# Patient Record
Sex: Female | Born: 2000 | Race: Black or African American | Hispanic: No | Marital: Single | Smoking: Never smoker
Health system: Southern US, Community
[De-identification: ages and names within clinical notes are randomized; demographics above are authoritative.]

## PROBLEM LIST (undated history)

## (undated) DIAGNOSIS — Z975 Presence of (intrauterine) contraceptive device: Secondary | ICD-10-CM

## (undated) DIAGNOSIS — R1031 Right lower quadrant pain: Secondary | ICD-10-CM

## (undated) DIAGNOSIS — N83202 Unspecified ovarian cyst, left side: Secondary | ICD-10-CM

## (undated) DIAGNOSIS — R1030 Lower abdominal pain, unspecified: Principal | ICD-10-CM

## (undated) DIAGNOSIS — E669 Obesity, unspecified: Secondary | ICD-10-CM

## (undated) HISTORY — DX: Obesity, unspecified: E66.9

## (undated) HISTORY — DX: Right lower quadrant pain: R10.31

## (undated) HISTORY — DX: Lower abdominal pain, unspecified: R10.30

## (undated) HISTORY — PX: TONSILLECTOMY: SUR1361

## (undated) HISTORY — DX: Presence of (intrauterine) contraceptive device: Z97.5

## (undated) HISTORY — DX: Unspecified ovarian cyst, left side: N83.202

---
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
## 2018-05-28 DIAGNOSIS — Z111 Encounter for screening for respiratory tuberculosis: Secondary | ICD-10-CM | POA: Diagnosis not present

## 2018-06-28 DIAGNOSIS — H1089 Other conjunctivitis: Secondary | ICD-10-CM | POA: Diagnosis not present

## 2018-06-28 DIAGNOSIS — J069 Acute upper respiratory infection, unspecified: Secondary | ICD-10-CM | POA: Diagnosis not present

## 2018-06-28 DIAGNOSIS — H9201 Otalgia, right ear: Secondary | ICD-10-CM | POA: Diagnosis not present

## 2018-09-25 ENCOUNTER — Ambulatory Visit (INDEPENDENT_AMBULATORY_CARE_PROVIDER_SITE_OTHER): Payer: Medicaid Other | Admitting: Adult Health

## 2018-09-25 ENCOUNTER — Encounter: Payer: Self-pay | Admitting: Adult Health

## 2018-09-25 VITALS — BP 119/77 | HR 69 | Ht 62.0 in | Wt 212.0 lb

## 2018-09-25 DIAGNOSIS — Z30011 Encounter for initial prescription of contraceptive pills: Secondary | ICD-10-CM

## 2018-09-25 DIAGNOSIS — Z3202 Encounter for pregnancy test, result negative: Secondary | ICD-10-CM | POA: Diagnosis not present

## 2018-09-25 DIAGNOSIS — N946 Dysmenorrhea, unspecified: Secondary | ICD-10-CM | POA: Insufficient documentation

## 2018-09-25 LAB — POCT URINE PREGNANCY: Preg Test, Ur: NEGATIVE

## 2018-09-25 MED ORDER — NORETHIN-ETH ESTRAD-FE BIPHAS 1 MG-10 MCG / 10 MCG PO TABS: 1.0000 | ORAL_TABLET | Freq: Every day | ORAL | 11 refills | Status: DC

## 2018-09-25 NOTE — Patient Instructions (Signed)
Oral Contraception Use Oral contraceptive pills (OCPs) are medicines taken to prevent pregnancy. OCPs work by preventing the ovaries from releasing eggs. The hormones in OCPs also cause the cervical mucus to thicken, preventing the sperm from entering the uterus. The hormones also cause the uterine lining to become thin, not allowing a fertilized egg to attach to the inside of the uterus. OCPs are highly effective when taken exactly as prescribed. However, OCPs do not prevent sexually transmitted diseases (STDs). Safe sex practices, such as using condoms along with an OCP, can help prevent STDs. Before taking OCPs, you may have a physical exam and Pap test. Your health care provider may also order blood tests if necessary. Your health care provider will make sure you are a good candidate for oral contraception. Discuss with your health care provider the possible side effects of the OCP you may be prescribed. When starting an OCP, it can take 2 to 3 months for the body to adjust to the changes in hormone levels in your body. How to take oral contraceptive pills Your health care provider may advise you on how to start taking the first cycle of OCPs. Otherwise, you can:  Start on day 1 of your menstrual period. You will not need any backup contraceptive protection with this start time.  Start on the first Sunday after your menstrual period or the day you get your prescription. In these cases, you will need to use backup contraceptive protection for the first week.  Start the pill at any time of your cycle. If you take the pill within 5 days of the start of your period, you are protected against pregnancy right away. In this case, you will not need a backup form of birth control. If you start at any other time of your menstrual cycle, you will need to use another form of birth control for 7 days. If your OCP is the type called a minipill, it will protect you from pregnancy after taking it for 2 days (48  hours).  After you have started taking OCPs:  If you forget to take 1 pill, take it as soon as you remember. Take the next pill at the regular time.  If you miss 2 or more pills, call your health care provider because different pills have different instructions for missed doses. Use backup birth control until your next menstrual period starts.  If you use a 28-day pack that contains inactive pills and you miss 1 of the last 7 pills (pills with no hormones), it will not matter. Throw away the rest of the non-hormone pills and start a new pill pack.  No matter which day you start the OCP, you will always start a new pack on that same day of the week. Have an extra pack of OCPs and a backup contraceptive method available in case you miss some pills or lose your OCP pack. Follow these instructions at home:  Do not smoke.  Always use a condom to protect against STDs. OCPs do not protect against STDs.  Use a calendar to mark your menstrual period days.  Read the information and directions that came with your OCP. Talk to your health care provider if you have questions. Contact a health care provider if:  You develop nausea and vomiting.  You have abnormal vaginal discharge or bleeding.  You develop a rash.  You miss your menstrual period.  You are losing your hair.  You need treatment for mood swings or depression.  You   get dizzy when taking the OCP.  You develop acne from taking the OCP.  You become pregnant. Get help right away if:  You develop chest pain.  You develop shortness of breath.  You have an uncontrolled or severe headache.  You develop numbness or slurred speech.  You develop visual problems.  You develop pain, redness, and swelling in the legs. This information is not intended to replace advice given to you by your health care provider. Make sure you discuss any questions you have with your health care provider. Document Released: 10/05/2011 Document  Revised: 03/23/2016 Document Reviewed: 04/06/2013 Elsevier Interactive Patient Education  2017 Elsevier Inc.  

## 2018-09-25 NOTE — Progress Notes (Signed)
  Subjective:     Patient ID: Cynthia Duarte, female   DOB: 10/21/2001, 17 y.o.   MRN: [id number]  HPI Cynthia Duarte is a 17 year old black female in to discuss birth control, has had nexplanon in past but is thinking of trying OCs, mom ask about patch but she weighs too much.   Review of Systems Periods last 3-4 days +period cramps Has had sex,but not currently active. Reviewed past medical,surgical, social and family history. Reviewed medications and allergies.     Objective:   Physical Exam BP 119/77 (BP Location: Left Arm, Patient Position: Sitting, Cuff Size: Normal)   Pulse 69   Ht 5\' 2"  (1.575 m)   Wt 212 lb (96.2 kg)   LMP 08/23/2018 (Exact Date)   BMI 38.78 kg/m UPT negative. PHQ 2 score 0 Fall risk low. Skin warm and dry. Neck: mid line trachea, normal thyroid, good ROM, no lymphadenopathy noted. Lungs: clear to ausculation bilaterally. Cardiovascular: regular rate and rhythm. Discussed OCs and she wants to try them first,but may want nexplanon.     Assessment:     1. Encounter for initial prescription of contraceptive pills   2. Pregnancy test negative   3. Menstrual cramps       Plan:    Start lo loestrin today, given 1 pack to start today, use condoms Meds ordered this encounter  Medications  . Norethindrone-Ethinyl Estradiol-Fe Biphas (LO LOESTRIN FE) 1 MG-10 MCG / 10 MCG tablet    Sig: Take 1 tablet by mouth daily. Take 1 daily by mouth    Dispense:  1 Package    Refill:  11    BIN [id number], PCN [id number], GRP [id number],ID [id number]    Order Specific Question:   Supervising Provider    Answer:   Cynthia Duarte, Cynthia Duarte [[id number]]  F/U in 3 months  Review handout on OC use

## 2018-10-11 ENCOUNTER — Encounter: Payer: Self-pay | Admitting: Advanced Practice Midwife

## 2018-10-11 DIAGNOSIS — Z3201 Encounter for pregnancy test, result positive: Secondary | ICD-10-CM | POA: Diagnosis not present

## 2018-10-11 DIAGNOSIS — H9203 Otalgia, bilateral: Secondary | ICD-10-CM | POA: Diagnosis not present

## 2018-10-11 DIAGNOSIS — R300 Dysuria: Secondary | ICD-10-CM | POA: Diagnosis not present

## 2018-10-15 ENCOUNTER — Ambulatory Visit (INDEPENDENT_AMBULATORY_CARE_PROVIDER_SITE_OTHER): Payer: Medicaid Other | Admitting: Adult Health

## 2018-10-15 ENCOUNTER — Encounter: Payer: Self-pay | Admitting: Adult Health

## 2018-10-15 VITALS — BP 116/71 | HR 81 | Ht 62.0 in | Wt 211.0 lb

## 2018-10-15 DIAGNOSIS — R110 Nausea: Secondary | ICD-10-CM

## 2018-10-15 DIAGNOSIS — N390 Urinary tract infection, site not specified: Secondary | ICD-10-CM | POA: Diagnosis not present

## 2018-10-15 DIAGNOSIS — Z3201 Encounter for pregnancy test, result positive: Secondary | ICD-10-CM | POA: Diagnosis not present

## 2018-10-15 DIAGNOSIS — N926 Irregular menstruation, unspecified: Secondary | ICD-10-CM | POA: Diagnosis not present

## 2018-10-15 DIAGNOSIS — Z3A01 Less than 8 weeks gestation of pregnancy: Secondary | ICD-10-CM

## 2018-10-15 DIAGNOSIS — O3680X0 Pregnancy with inconclusive fetal viability, not applicable or unspecified: Secondary | ICD-10-CM

## 2018-10-15 LAB — POCT URINE PREGNANCY: PREG TEST UR: POSITIVE — AB

## 2018-10-15 MED ORDER — NITROFURANTOIN MONOHYD MACRO 100 MG PO CAPS: 100.0000 mg | ORAL_CAPSULE | Freq: Two times a day (BID) | ORAL | 0 refills | Status: DC

## 2018-10-15 MED ORDER — PRENATAL PLUS 27-1 MG PO TABS: 1.0000 | ORAL_TABLET | Freq: Every day | ORAL | 0 refills | Status: DC

## 2018-10-15 NOTE — Progress Notes (Signed)
Patient ID: Cynthia Duarte, female   DOB: 04/12/2001, 17 y.o.   MRN: [id number] History of Present Illness: Cynthia Duarte is a 17 year old black female in for UPT, she missed a period, and was on OCs, and had +HPT. Her mom is with her.  She was seen at [facility] in [place] and had urine culture done and was + E coli, and they want us to treat.    Current Medications, Allergies, Past Medical History, Past Surgical History, Family History and Social History were reviewed in [facility] electronic medical record.     Review of Systems: +missed period with +HPT +nausea Constipated at times     Physical Exam:BP 116/71 (BP Location: Left Arm, Patient Position: Sitting, Cuff Size: Normal)   Pulse 81   Ht 5\' 2"  (1.575 m)   Wt 211 lb (95.7 kg)   LMP 08/23/2018 (Exact Date)   BMI 38.59 kg/m UPT+,a bout 7+4 weeks by LMP with EDD 05/30/19. General:  Well developed, well nourished, no acute distress Skin:  Warm and dry Neck:  Midline trachea, normal thyroid, good ROM, no lymphadenopathy Lungs; Clear to auscultation bilaterally Cardiovascular: Regular rate and rhythm Abdomen:  Soft, non tender,  Psych:  No mood changes, alert and cooperative,seems happy PHQ 2 score 0. Fall risk is low.  Impression:  1. Pregnancy test positive   2. Less than [redacted] weeks gestation of pregnancy   3. Encounter to determine fetal viability of pregnancy, single or unspecified fetus   4. Urinary tract infection without hematuria, site unspecified      Plan: Meds ordered this encounter  Medications  . prenatal vitamin w/FE, FA (PRENATAL 1 + 1) 27-1 MG TABS tablet    Sig: Take 1 tablet by mouth daily at 12 noon.    Dispense:  30 each    Refill:  0    Order Specific Question:   Supervising Provider    Answer:   Despina HiddenEURE, LUTHER H [[id number]]  . nitrofurantoin, macrocrystal-monohydrate, (MACROBID) 100 MG capsule    Sig: Take 1 capsule (100 mg total) by mouth 2 (two) times daily.    Dispense:  14 capsule   Refill:  0    Order Specific Question:   Supervising Provider    Answer:   Cynthia LopeEURE, LUTHER H [[id number]]  Return in 2-3 weeks for Dating US and then the next week for new OB Review handouts on First trimester and by [facility] and having a healthy baby Eat often,increase fruit and fiber, can try colace, or senokot if needed

## 2018-10-15 NOTE — Patient Instructions (Signed)
First Trimester of Pregnancy The first trimester of pregnancy is from week 1 until the end of week 13 (months 1 through 3). A week after a sperm fertilizes an egg, the egg will implant on the wall of the uterus. This embryo will begin to develop into a baby. Genes from you and your partner will form the baby. The female genes will determine whether the baby will be a boy or a girl. At 6-8 weeks, the eyes and face will be formed, and the heartbeat can be seen on ultrasound. At the end of 12 weeks, all the baby's organs will be formed. Now that you are pregnant, you will want to do everything you can to have a healthy baby. Two of the most important things are to get good prenatal care and to follow your health care provider's instructions. Prenatal care is all the medical care you receive before the baby's birth. This care will help prevent, find, and treat any problems during the pregnancy and childbirth. Body changes during your first trimester Your body goes through many changes during pregnancy. The changes vary from woman to woman.  You may gain or lose a couple of pounds at first.  You may feel sick to your stomach (nauseous) and you may throw up (vomit). If the vomiting is uncontrollable, call your health care provider.  You may tire easily.  You may develop headaches that can be relieved by medicines. All medicines should be approved by your health care provider.  You may urinate more often. Painful urination may mean you have a bladder infection.  You may develop heartburn as a result of your pregnancy.  You may develop constipation because certain hormones are causing the muscles that push stool through your intestines to slow down.  You may develop hemorrhoids or swollen veins (varicose veins).  Your breasts may begin to grow larger and become tender. Your nipples may stick out more, and the tissue that surrounds them (areola) may become darker.  Your gums may bleed and may be  sensitive to brushing and flossing.  Dark spots or blotches (chloasma, mask of pregnancy) may develop on your face. This will likely fade after the baby is born.  Your menstrual periods will stop.  You may have a loss of appetite.  You may develop cravings for certain kinds of food.  You may have changes in your emotions from day to day, such as being excited to be pregnant or being concerned that something may go wrong with the pregnancy and baby.  You may have more vivid and strange dreams.  You may have changes in your hair. These can include thickening of your hair, rapid growth, and changes in texture. Some women also have hair loss during or after pregnancy, or hair that feels dry or thin. Your hair will most likely return to normal after your baby is born.  What to expect at prenatal visits During a routine prenatal visit:  You will be weighed to make sure you and the baby are growing normally.  Your blood pressure will be taken.  Your abdomen will be measured to track your baby's growth.  The fetal heartbeat will be listened to between weeks 10 and 14 of your pregnancy.  Test results from any previous visits will be discussed.  Your health care provider may ask you:  How you are feeling.  If you are feeling the baby move.  If you have had any abnormal symptoms, such as leaking fluid, bleeding, severe headaches,   or abdominal cramping.  If you are using any tobacco products, including cigarettes, chewing tobacco, and electronic cigarettes.  If you have any questions.  Other tests that may be performed during your first trimester include:  Blood tests to find your blood type and to check for the presence of any previous infections. The tests will also be used to check for low iron levels (anemia) and protein on red blood cells (Rh antibodies). Depending on your risk factors, or if you previously had diabetes during pregnancy, you may have tests to check for high blood  sugar that affects pregnant women (gestational diabetes).  Urine tests to check for infections, diabetes, or protein in the urine.  An ultrasound to confirm the proper growth and development of the baby.  Fetal screens for spinal cord problems (spina bifida) and Down syndrome.  HIV (human immunodeficiency virus) testing. Routine prenatal testing includes screening for HIV, unless you choose not to have this test.  You may need other tests to make sure you and the baby are doing well.  Follow these instructions at home: Medicines  Follow your health care provider's instructions regarding medicine use. Specific medicines may be either safe or unsafe to take during pregnancy.  Take a prenatal vitamin that contains at least 600 micrograms (mcg) of folic acid.  If you develop constipation, try taking a stool softener if your health care provider approves. Eating and drinking  Eat a balanced diet that includes fresh fruits and vegetables, whole grains, good sources of protein such as meat, eggs, or tofu, and low-fat dairy. Your health care provider will help you determine the amount of weight gain that is right for you.  Avoid raw meat and uncooked cheese. These carry germs that can cause birth defects in the baby.  Eating four or five small meals rather than three large meals a day may help relieve nausea and vomiting. If you start to feel nauseous, eating a few soda crackers can be helpful. Drinking liquids between meals, instead of during meals, also seems to help ease nausea and vomiting.  Limit foods that are high in fat and processed sugars, such as fried and sweet foods.  To prevent constipation: ? Eat foods that are high in fiber, such as fresh fruits and vegetables, whole grains, and beans. ? Drink enough fluid to keep your urine clear or pale yellow. Activity  Exercise only as directed by your health care provider. Most women can continue their usual exercise routine during  pregnancy. Try to exercise for 30 minutes at least 5 days a week. Exercising will help you: ? Control your weight. ? Stay in shape. ? Be prepared for labor and delivery.  Experiencing pain or cramping in the lower abdomen or lower back is a good sign that you should stop exercising. Check with your health care provider before continuing with normal exercises.  Try to avoid standing for long periods of time. Move your legs often if you must stand in one place for a long time.  Avoid heavy lifting.  Wear low-heeled shoes and practice good posture.  You may continue to have sex unless your health care provider tells you not to. Relieving pain and discomfort  Wear a good support bra to relieve breast tenderness.  Take warm sitz baths to soothe any pain or discomfort caused by hemorrhoids. Use hemorrhoid cream if your health care provider approves.  Rest with your legs elevated if you have leg cramps or low back pain.  If you develop   varicose veins in your legs, wear support hose. Elevate your feet for 15 minutes, 3-4 times a day. Limit salt in your diet. Prenatal care  Schedule your prenatal visits by the twelfth week of pregnancy. They are usually scheduled monthly at first, then more often in the last 2 months before delivery.  Write down your questions. Take them to your prenatal visits.  Keep all your prenatal visits as told by your health care provider. This is important. Safety  Wear your seat belt at all times when driving.  Make a list of emergency phone numbers, including numbers for family, friends, the hospital, and police and fire departments. General instructions  Ask your health care provider for a referral to a local prenatal education class. Begin classes no later than the beginning of month 6 of your pregnancy.  Ask for help if you have counseling or nutritional needs during pregnancy. Your health care provider can offer advice or refer you to specialists for help  with various needs.  Do not use hot tubs, steam rooms, or saunas.  Do not douche or use tampons or scented sanitary pads.  Do not cross your legs for long periods of time.  Avoid cat litter boxes and soil used by cats. These carry germs that can cause birth defects in the baby and possibly loss of the fetus by miscarriage or stillbirth.  Avoid all smoking, herbs, alcohol, and medicines not prescribed by your health care provider. Chemicals in these products affect the formation and growth of the baby.  Do not use any products that contain nicotine or tobacco, such as cigarettes and e-cigarettes. If you need help quitting, ask your health care provider. You may receive counseling support and other resources to help you quit.  Schedule a dentist appointment. At home, brush your teeth with a soft toothbrush and be gentle when you floss. Contact a health care provider if:  You have dizziness.  You have mild pelvic cramps, pelvic pressure, or nagging pain in the abdominal area.  You have persistent nausea, vomiting, or diarrhea.  You have a bad smelling vaginal discharge.  You have pain when you urinate.  You notice increased swelling in your face, hands, legs, or ankles.  You are exposed to fifth disease or chickenpox.  You are exposed to German measles (rubella) and have never had it. Get help right away if:  You have a fever.  You are leaking fluid from your vagina.  You have spotting or bleeding from your vagina.  You have severe abdominal cramping or pain.  You have rapid weight gain or loss.  You vomit blood or material that looks like coffee grounds.  You develop a severe headache.  You have shortness of breath.  You have any kind of trauma, such as from a fall or a car accident. Summary  The first trimester of pregnancy is from week 1 until the end of week 13 (months 1 through 3).  Your body goes through many changes during pregnancy. The changes vary from  woman to woman.  You will have routine prenatal visits. During those visits, your health care provider will examine you, discuss any test results you may have, and talk with you about how you are feeling. This information is not intended to replace advice given to you by your health care provider. Make sure you discuss any questions you have with your health care provider. Document Released: 10/10/2001 Document Revised: 09/27/2016 Document Reviewed: 09/27/2016 Elsevier Interactive Patient Education  2018 Elsevier   Inc.  

## 2018-10-16 DIAGNOSIS — Z0389 Encounter for observation for other suspected diseases and conditions ruled out: Secondary | ICD-10-CM | POA: Diagnosis not present

## 2018-10-16 DIAGNOSIS — Z3689 Encounter for other specified antenatal screening: Secondary | ICD-10-CM | POA: Diagnosis not present

## 2018-10-16 DIAGNOSIS — Z3401 Encounter for supervision of normal first pregnancy, first trimester: Secondary | ICD-10-CM | POA: Diagnosis not present

## 2018-10-21 DIAGNOSIS — O26841 Uterine size-date discrepancy, first trimester: Secondary | ICD-10-CM | POA: Diagnosis not present

## 2018-10-21 DIAGNOSIS — Z3A01 Less than 8 weeks gestation of pregnancy: Secondary | ICD-10-CM | POA: Diagnosis not present

## 2018-10-21 DIAGNOSIS — Z3689 Encounter for other specified antenatal screening: Secondary | ICD-10-CM | POA: Diagnosis not present

## 2018-11-04 ENCOUNTER — Encounter (INDEPENDENT_AMBULATORY_CARE_PROVIDER_SITE_OTHER): Payer: Self-pay

## 2018-11-04 ENCOUNTER — Ambulatory Visit (INDEPENDENT_AMBULATORY_CARE_PROVIDER_SITE_OTHER): Payer: Medicaid Other

## 2018-11-04 DIAGNOSIS — O3680X0 Pregnancy with inconclusive fetal viability, not applicable or unspecified: Secondary | ICD-10-CM | POA: Diagnosis not present

## 2018-11-04 NOTE — Progress Notes (Signed)
[place] 8 wks,single IUP,positive fht 160 bpm,normal ovaries bilat,crl 16.78 mm

## 2018-11-13 ENCOUNTER — Encounter: Payer: Medicaid Other | Admitting: Advanced Practice Midwife

## 2018-11-13 ENCOUNTER — Encounter: Payer: Self-pay | Admitting: Advanced Practice Midwife

## 2018-11-13 ENCOUNTER — Ambulatory Visit: Payer: Medicaid Other | Admitting: *Deleted

## 2018-11-18 DIAGNOSIS — Z3689 Encounter for other specified antenatal screening: Secondary | ICD-10-CM | POA: Diagnosis not present

## 2018-12-05 DIAGNOSIS — Z3682 Encounter for antenatal screening for nuchal translucency: Secondary | ICD-10-CM | POA: Diagnosis not present

## 2018-12-05 DIAGNOSIS — Z3689 Encounter for other specified antenatal screening: Secondary | ICD-10-CM | POA: Diagnosis not present

## 2018-12-05 DIAGNOSIS — R8290 Unspecified abnormal findings in urine: Secondary | ICD-10-CM | POA: Diagnosis not present

## 2018-12-05 DIAGNOSIS — Z23 Encounter for immunization: Secondary | ICD-10-CM | POA: Diagnosis not present

## 2018-12-05 DIAGNOSIS — Z3A13 13 weeks gestation of pregnancy: Secondary | ICD-10-CM | POA: Diagnosis not present

## 2018-12-11 DIAGNOSIS — O351XX0 Maternal care for (suspected) chromosomal abnormality in fetus, not applicable or unspecified: Secondary | ICD-10-CM | POA: Diagnosis not present

## 2018-12-11 DIAGNOSIS — O289 Unspecified abnormal findings on antenatal screening of mother: Secondary | ICD-10-CM | POA: Diagnosis not present

## 2018-12-26 ENCOUNTER — Ambulatory Visit: Payer: Medicaid Other | Admitting: Adult Health

## 2018-12-26 ENCOUNTER — Encounter: Payer: Self-pay | Admitting: *Deleted

## 2019-01-18 ENCOUNTER — Other Ambulatory Visit: Payer: Self-pay | Admitting: Adult Health

## 2019-01-30 DIAGNOSIS — Z362 Encounter for other antenatal screening follow-up: Secondary | ICD-10-CM | POA: Diagnosis not present

## 2019-01-30 DIAGNOSIS — Z3689 Encounter for other specified antenatal screening: Secondary | ICD-10-CM | POA: Diagnosis not present

## 2019-01-30 DIAGNOSIS — Z3A19 19 weeks gestation of pregnancy: Secondary | ICD-10-CM | POA: Diagnosis not present

## 2019-01-30 DIAGNOSIS — Z3492 Encounter for supervision of normal pregnancy, unspecified, second trimester: Secondary | ICD-10-CM | POA: Diagnosis not present

## 2019-02-20 DIAGNOSIS — R42 Dizziness and giddiness: Secondary | ICD-10-CM | POA: Diagnosis not present

## 2019-02-20 DIAGNOSIS — Z20828 Contact with and (suspected) exposure to other viral communicable diseases: Secondary | ICD-10-CM | POA: Diagnosis not present

## 2019-02-20 DIAGNOSIS — R52 Pain, unspecified: Secondary | ICD-10-CM | POA: Diagnosis not present

## 2019-02-20 DIAGNOSIS — R6883 Chills (without fever): Secondary | ICD-10-CM | POA: Diagnosis not present

## 2019-02-20 DIAGNOSIS — Z3A33 33 weeks gestation of pregnancy: Secondary | ICD-10-CM | POA: Diagnosis not present

## 2019-02-20 DIAGNOSIS — Z3A23 23 weeks gestation of pregnancy: Secondary | ICD-10-CM | POA: Diagnosis not present

## 2019-02-20 DIAGNOSIS — B349 Viral infection, unspecified: Secondary | ICD-10-CM | POA: Diagnosis not present

## 2019-02-20 DIAGNOSIS — O9989 Other specified diseases and conditions complicating pregnancy, childbirth and the puerperium: Secondary | ICD-10-CM | POA: Diagnosis not present

## 2019-02-20 DIAGNOSIS — O26892 Other specified pregnancy related conditions, second trimester: Secondary | ICD-10-CM | POA: Diagnosis not present

## 2019-02-20 DIAGNOSIS — O98512 Other viral diseases complicating pregnancy, second trimester: Secondary | ICD-10-CM | POA: Diagnosis not present

## 2019-03-12 DIAGNOSIS — Z3689 Encounter for other specified antenatal screening: Secondary | ICD-10-CM | POA: Diagnosis not present

## 2019-03-12 DIAGNOSIS — Z23 Encounter for immunization: Secondary | ICD-10-CM | POA: Diagnosis not present

## 2019-04-07 DIAGNOSIS — Z3A30 30 weeks gestation of pregnancy: Secondary | ICD-10-CM | POA: Diagnosis not present

## 2019-04-08 DIAGNOSIS — Z3A30 30 weeks gestation of pregnancy: Secondary | ICD-10-CM | POA: Diagnosis not present

## 2019-04-26 DIAGNOSIS — O26893 Other specified pregnancy related conditions, third trimester: Secondary | ICD-10-CM | POA: Diagnosis not present

## 2019-04-26 DIAGNOSIS — Z881 Allergy status to other antibiotic agents status: Secondary | ICD-10-CM | POA: Diagnosis not present

## 2019-04-26 DIAGNOSIS — Z91048 Other nonmedicinal substance allergy status: Secondary | ICD-10-CM | POA: Diagnosis not present

## 2019-04-26 DIAGNOSIS — R6889 Other general symptoms and signs: Secondary | ICD-10-CM | POA: Diagnosis not present

## 2019-04-26 DIAGNOSIS — Z3A33 33 weeks gestation of pregnancy: Secondary | ICD-10-CM | POA: Diagnosis not present

## 2019-04-27 DIAGNOSIS — O26893 Other specified pregnancy related conditions, third trimester: Secondary | ICD-10-CM | POA: Diagnosis not present

## 2019-04-27 DIAGNOSIS — Z3A33 33 weeks gestation of pregnancy: Secondary | ICD-10-CM | POA: Diagnosis not present

## 2019-04-27 DIAGNOSIS — R300 Dysuria: Secondary | ICD-10-CM | POA: Diagnosis not present

## 2019-04-27 DIAGNOSIS — Z881 Allergy status to other antibiotic agents status: Secondary | ICD-10-CM | POA: Diagnosis not present

## 2019-04-27 DIAGNOSIS — Z91048 Other nonmedicinal substance allergy status: Secondary | ICD-10-CM | POA: Diagnosis not present

## 2019-05-11 DIAGNOSIS — R109 Unspecified abdominal pain: Secondary | ICD-10-CM | POA: Diagnosis not present

## 2019-05-11 DIAGNOSIS — Z3A35 35 weeks gestation of pregnancy: Secondary | ICD-10-CM | POA: Diagnosis not present

## 2019-05-11 DIAGNOSIS — Z881 Allergy status to other antibiotic agents status: Secondary | ICD-10-CM | POA: Diagnosis not present

## 2019-05-11 DIAGNOSIS — O26893 Other specified pregnancy related conditions, third trimester: Secondary | ICD-10-CM | POA: Diagnosis not present

## 2019-05-21 DIAGNOSIS — O26893 Other specified pregnancy related conditions, third trimester: Secondary | ICD-10-CM | POA: Diagnosis not present

## 2019-05-21 DIAGNOSIS — R109 Unspecified abdominal pain: Secondary | ICD-10-CM | POA: Diagnosis not present

## 2019-05-21 DIAGNOSIS — Z3A36 36 weeks gestation of pregnancy: Secondary | ICD-10-CM | POA: Diagnosis not present

## 2019-05-22 DIAGNOSIS — Z3403 Encounter for supervision of normal first pregnancy, third trimester: Secondary | ICD-10-CM | POA: Diagnosis not present

## 2019-06-06 DIAGNOSIS — Z3A38 38 weeks gestation of pregnancy: Secondary | ICD-10-CM | POA: Diagnosis not present

## 2019-06-06 DIAGNOSIS — Z881 Allergy status to other antibiotic agents status: Secondary | ICD-10-CM | POA: Diagnosis not present

## 2019-06-10 DIAGNOSIS — Z3A39 39 weeks gestation of pregnancy: Secondary | ICD-10-CM | POA: Diagnosis not present

## 2019-06-10 DIAGNOSIS — O864 Pyrexia of unknown origin following delivery: Secondary | ICD-10-CM | POA: Diagnosis not present

## 2019-06-10 DIAGNOSIS — D500 Iron deficiency anemia secondary to blood loss (chronic): Secondary | ICD-10-CM | POA: Diagnosis not present

## 2019-06-10 DIAGNOSIS — O9902 Anemia complicating childbirth: Secondary | ICD-10-CM | POA: Diagnosis not present

## 2019-07-24 DIAGNOSIS — Z3046 Encounter for surveillance of implantable subdermal contraceptive: Secondary | ICD-10-CM | POA: Diagnosis not present

## 2019-07-24 DIAGNOSIS — Z30017 Encounter for initial prescription of implantable subdermal contraceptive: Secondary | ICD-10-CM | POA: Diagnosis not present

## 2019-07-24 DIAGNOSIS — Z1330 Encounter for screening examination for mental health and behavioral disorders, unspecified: Secondary | ICD-10-CM | POA: Diagnosis not present

## 2019-07-24 DIAGNOSIS — Z3202 Encounter for pregnancy test, result negative: Secondary | ICD-10-CM | POA: Diagnosis not present

## 2019-09-05 ENCOUNTER — Emergency Department (HOSPITAL_COMMUNITY)
Admission: EM | Admit: 2019-09-05 | Discharge: 2019-09-05 | Disposition: A | Discharge status: Home / Self Care | Payer: Medicaid Other | Attending: Emergency Medicine | Admitting: Emergency Medicine

## 2019-09-05 ENCOUNTER — Emergency Department (HOSPITAL_COMMUNITY): Payer: Medicaid Other

## 2019-09-05 ENCOUNTER — Encounter: Payer: Self-pay | Admitting: Emergency Medicine

## 2019-09-05 DIAGNOSIS — R519 Headache, unspecified: Secondary | ICD-10-CM | POA: Diagnosis not present

## 2019-09-05 DIAGNOSIS — R0602 Shortness of breath: Secondary | ICD-10-CM | POA: Diagnosis not present

## 2019-09-05 DIAGNOSIS — R42 Dizziness and giddiness: Secondary | ICD-10-CM | POA: Diagnosis not present

## 2019-09-05 DIAGNOSIS — M545 Low back pain: Secondary | ICD-10-CM | POA: Diagnosis not present

## 2019-09-05 DIAGNOSIS — M542 Cervicalgia: Secondary | ICD-10-CM | POA: Diagnosis not present

## 2019-09-05 DIAGNOSIS — M25561 Pain in right knee: Secondary | ICD-10-CM | POA: Diagnosis not present

## 2019-09-05 DIAGNOSIS — R109 Unspecified abdominal pain: Secondary | ICD-10-CM | POA: Diagnosis not present

## 2019-09-05 DIAGNOSIS — R0789 Other chest pain: Secondary | ICD-10-CM | POA: Insufficient documentation

## 2019-09-05 DIAGNOSIS — S3992XA Unspecified injury of lower back, initial encounter: Secondary | ICD-10-CM | POA: Diagnosis not present

## 2019-09-05 DIAGNOSIS — S3991XA Unspecified injury of abdomen, initial encounter: Secondary | ICD-10-CM | POA: Diagnosis not present

## 2019-09-05 DIAGNOSIS — S0990XA Unspecified injury of head, initial encounter: Secondary | ICD-10-CM | POA: Diagnosis not present

## 2019-09-05 DIAGNOSIS — S299XXA Unspecified injury of thorax, initial encounter: Secondary | ICD-10-CM | POA: Diagnosis not present

## 2019-09-05 DIAGNOSIS — M25461 Effusion, right knee: Secondary | ICD-10-CM | POA: Diagnosis not present

## 2019-09-05 DIAGNOSIS — S199XXA Unspecified injury of neck, initial encounter: Secondary | ICD-10-CM | POA: Diagnosis not present

## 2019-09-05 DIAGNOSIS — M549 Dorsalgia, unspecified: Secondary | ICD-10-CM | POA: Insufficient documentation

## 2019-09-05 LAB — CBC WITH DIFFERENTIAL/PLATELET
Abs Immature Granulocytes: 0.04 10*3/uL (ref 0.00–0.07)
Basophils Absolute: 0 10*3/uL (ref 0.0–0.1)
Basophils Relative: 0 %
Eosinophils Absolute: 0.1 10*3/uL (ref 0.0–0.5)
Eosinophils Relative: 1 %
HCT: 35.6 % — ABNORMAL LOW (ref 36.0–46.0)
Hemoglobin: 11.2 g/dL — ABNORMAL LOW (ref 12.0–15.0)
Immature Granulocytes: 0 %
Lymphocytes Relative: 24 %
Lymphs Abs: 3 10*3/uL (ref 0.7–4.0)
MCH: 26.8 pg (ref 26.0–34.0)
MCHC: 31.5 g/dL (ref 30.0–36.0)
MCV: 85.2 fL (ref 80.0–100.0)
Monocytes Absolute: 0.6 10*3/uL (ref 0.1–1.0)
Monocytes Relative: 5 %
Neutro Abs: 9 10*3/uL — ABNORMAL HIGH (ref 1.7–7.7)
Neutrophils Relative %: 70 %
Platelets: 325 10*3/uL (ref 150–400)
RBC: 4.18 MIL/uL (ref 3.87–5.11)
RDW: 16.7 % — ABNORMAL HIGH (ref 11.5–15.5)
WBC: 12.8 10*3/uL — ABNORMAL HIGH (ref 4.0–10.5)
nRBC: 0 % (ref 0.0–0.2)

## 2019-09-05 LAB — URINALYSIS, ROUTINE W REFLEX MICROSCOPIC
Bacteria, UA: NONE SEEN
Bilirubin Urine: NEGATIVE
Glucose, UA: NEGATIVE mg/dL
Hgb urine dipstick: NEGATIVE
Ketones, ur: NEGATIVE mg/dL
Leukocytes,Ua: NEGATIVE
Nitrite: NEGATIVE
Protein, ur: 30 mg/dL — AB
Specific Gravity, Urine: 1.018 (ref 1.005–1.030)
pH: 6 (ref 5.0–8.0)

## 2019-09-05 LAB — COMPREHENSIVE METABOLIC PANEL
ALT: 18 U/L (ref 0–44)
AST: 25 U/L (ref 15–41)
Albumin: 3.5 g/dL (ref 3.5–5.0)
Alkaline Phosphatase: 57 U/L (ref 38–126)
Anion gap: 6 (ref 5–15)
BUN: 13 mg/dL (ref 6–20)
CO2: 23 mmol/L (ref 22–32)
Calcium: 9 mg/dL (ref 8.9–10.3)
Chloride: 110 mmol/L (ref 98–111)
Creatinine, Ser: 1.02 mg/dL — ABNORMAL HIGH (ref 0.44–1.00)
GFR calc Af Amer: >60 mL/min (ref >60)
GFR calc non Af Amer: >60 mL/min (ref >60)
Glucose, Bld: 87 mg/dL (ref 70–99)
Potassium: 4.4 mmol/L (ref 3.5–5.1)
Sodium: 139 mmol/L (ref 135–145)
Total Bilirubin: 1 mg/dL (ref 0.3–1.2)
Total Protein: 6.8 g/dL (ref 6.5–8.1)

## 2019-09-05 LAB — I-STAT BETA HCG BLOOD, ED (MC, WL, AP ONLY): I-stat hCG, quantitative: <5 m[IU]/mL (ref <5)

## 2019-09-05 LAB — LIPASE, BLOOD: Lipase: 21 U/L (ref 11–51)

## 2019-09-05 MED ORDER — IOHEXOL 300 MG/ML  SOLN
100.0000 mL | Freq: Once | INTRAMUSCULAR | Status: AC | PRN
  Administered 2019-09-05: 100 mL via INTRAVENOUS

## 2019-09-05 MED ORDER — ACETAMINOPHEN 500 MG PO TABS
500.0000 mg | ORAL_TABLET | Freq: Once | ORAL | Status: AC
  Administered 2019-09-05: 500 mg via ORAL
  Filled 2019-09-05: qty 1
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
## 2019-09-05 NOTE — Progress Notes (Signed)
Orthopedic Tech Progress Note Patient Details:  Cynthia Duarte April 27, 2001 [id number]  Ortho Devices Type of Ortho Device: Knee Sleeve Ortho Device/Splint Location: right Ortho Device/Splint Interventions: Application   Post Interventions Patient Tolerated: Well Instructions Provided: Care of device   [place] Pink 09/05/2019, 6:09 PM
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
## 2019-10-15 DIAGNOSIS — N912 Amenorrhea, unspecified: Secondary | ICD-10-CM | POA: Diagnosis not present

## 2019-11-05 DIAGNOSIS — Z202 Contact with and (suspected) exposure to infections with a predominantly sexual mode of transmission: Secondary | ICD-10-CM | POA: Diagnosis not present

## 2019-11-24 DIAGNOSIS — Z3046 Encounter for surveillance of implantable subdermal contraceptive: Secondary | ICD-10-CM | POA: Diagnosis not present
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
## 2020-01-19 DIAGNOSIS — N926 Irregular menstruation, unspecified: Secondary | ICD-10-CM | POA: Diagnosis not present

## 2020-01-19 DIAGNOSIS — Z3202 Encounter for pregnancy test, result negative: Secondary | ICD-10-CM | POA: Diagnosis not present

## 2020-02-11 DIAGNOSIS — L2084 Intrinsic (allergic) eczema: Secondary | ICD-10-CM | POA: Diagnosis not present

## 2020-02-11 DIAGNOSIS — Z6838 Body mass index (BMI) 38.0-38.9, adult: Secondary | ICD-10-CM | POA: Diagnosis not present

## 2020-02-11 DIAGNOSIS — R635 Abnormal weight gain: Secondary | ICD-10-CM | POA: Diagnosis not present

## 2020-02-11 DIAGNOSIS — Z13220 Encounter for screening for lipoid disorders: Secondary | ICD-10-CM | POA: Diagnosis not present

## 2020-02-11 DIAGNOSIS — Z0000 Encounter for general adult medical examination without abnormal findings: Secondary | ICD-10-CM | POA: Diagnosis not present

## 2020-02-11 DIAGNOSIS — Z8759 Personal history of other complications of pregnancy, childbirth and the puerperium: Secondary | ICD-10-CM | POA: Diagnosis not present

## 2020-02-20 DIAGNOSIS — Z113 Encounter for screening for infections with a predominantly sexual mode of transmission: Secondary | ICD-10-CM | POA: Diagnosis not present

## 2020-02-20 DIAGNOSIS — N898 Other specified noninflammatory disorders of vagina: Secondary | ICD-10-CM | POA: Diagnosis not present

## 2020-03-16 DIAGNOSIS — Z23 Encounter for immunization: Secondary | ICD-10-CM | POA: Diagnosis not present

## 2020-03-18 ENCOUNTER — Other Ambulatory Visit: Payer: Self-pay

## 2020-03-18 ENCOUNTER — Encounter: Payer: Self-pay | Admitting: Adult Health

## 2020-03-18 ENCOUNTER — Ambulatory Visit: Payer: Medicaid Other | Admitting: Adult Health

## 2020-03-18 VITALS — BP 110/71 | HR 91 | Ht 62.0 in | Wt 232.0 lb

## 2020-03-18 DIAGNOSIS — Z319 Encounter for procreative management, unspecified: Secondary | ICD-10-CM | POA: Insufficient documentation

## 2020-03-18 DIAGNOSIS — N898 Other specified noninflammatory disorders of vagina: Secondary | ICD-10-CM | POA: Diagnosis not present

## 2020-03-18 DIAGNOSIS — N760 Acute vaginitis: Secondary | ICD-10-CM | POA: Diagnosis not present

## 2020-03-18 DIAGNOSIS — B9689 Other specified bacterial agents as the cause of diseases classified elsewhere: Secondary | ICD-10-CM

## 2020-03-18 LAB — POCT WET PREP (WET MOUNT)
Clue Cells Wet Prep Whiff POC: POSITIVE
WBC, Wet Prep HPF POC: POSITIVE

## 2020-03-18 MED ORDER — METRONIDAZOLE 500 MG PO TABS: 500.0000 mg | ORAL_TABLET | Freq: Two times a day (BID) | ORAL | 0 refills | Status: DC

## 2020-03-18 NOTE — Progress Notes (Signed)
  Subjective:     Patient ID: Cynthia Duarte, female   DOB: [birth year], 19 y.o.   MRN: [id number]  HPI Cynthia Duarte is a 19 year old black female, single, G2P1, in complaining of vaginal discharge and odor.She thinks she had miscarriage in March, and would like to get pregnant again she says. She has 80 month old. Her mom is with her. PCP is Dr Conni Elliot  Review of Systems Vaginal discharge with odor Some low abdominal pain at times Reviewed past medical,surgical, social and family history. Reviewed medications and allergies.     Objective:   Physical Exam BP 110/71 (BP Location: Left Arm, Patient Position: Sitting, Cuff Size: Normal)   Pulse 91   Ht 5\' 2"  (1.575 m)   Wt 232 lb (105.2 kg)   LMP 03/03/2020   BMI 42.43 kg/m  Skin warm and dry.Pelvic: external genitalia is normal in appearance no lesions, vagina: white discharge with odor,urethra has no lesions or masses noted, cervix:smooth and bulbous, uterus: normal size, shape and contour, non tender, no masses felt, adnexa: no masses or tenderness noted. Bladder is non tender and no masses felt. Wet prep: + for clue cells and +WBCs. Nuswab obtained Examination chaperoned by 05/03/2020 LPN Fall risk is low PHQ 2 score is 0.     Assessment:     1. Vaginal discharge nuswab sent  2. Vaginal odor nuswab sent  3. BV (bacterial vaginosis) Will rx flagyl Meds ordered this encounter  Medications  . metroNIDAZOLE (FLAGYL) 500 MG tablet    Sig: Take 1 tablet (500 mg total) by mouth 2 (two) times daily.    Dispense:  14 tablet    Refill:  0    Order Specific Question:   Supervising Provider    Answer:   Faith Rogue, LUTHER H [[id number]]    4. Patient desires pregnancy Check progesterone level 5/25    Plan:     Will talk when results back Follow up prn

## 2020-03-19 ENCOUNTER — Other Ambulatory Visit: Payer: Medicaid Other

## 2020-03-19 DIAGNOSIS — Z3201 Encounter for pregnancy test, result positive: Secondary | ICD-10-CM | POA: Diagnosis not present

## 2020-03-20 LAB — BETA HCG QUANT (REF LAB): hCG Quant: <1 m[IU]/mL

## 2020-03-22 LAB — NUSWAB VAGINITIS PLUS (VG+)
Atopobium vaginae: HIGH Score — AB
BVAB 2: HIGH Score — AB
Candida albicans, NAA: NEGATIVE
Candida glabrata, NAA: NEGATIVE
Chlamydia trachomatis, NAA: NEGATIVE
Megasphaera 1: HIGH Score — AB
Neisseria gonorrhoeae, NAA: NEGATIVE
Trich vag by NAA: NEGATIVE

## 2020-04-13 DIAGNOSIS — Z23 Encounter for immunization: Secondary | ICD-10-CM | POA: Diagnosis not present

## 2020-05-31 ENCOUNTER — Other Ambulatory Visit: Payer: Self-pay

## 2020-05-31 MED ORDER — METRONIDAZOLE 500 MG PO TABS: 500.0000 mg | ORAL_TABLET | Freq: Two times a day (BID) | ORAL | 0 refills | Status: DC

## 2020-06-15 ENCOUNTER — Other Ambulatory Visit: Payer: Medicaid Other

## 2020-06-28 ENCOUNTER — Other Ambulatory Visit (HOSPITAL_COMMUNITY)
Admission: RE | Admit: 2020-06-28 | Discharge: 2020-06-28 | Disposition: A | Discharge status: Home / Self Care | Payer: Medicaid Other | Source: Ambulatory Visit | Attending: Adult Health | Admitting: Adult Health

## 2020-06-28 ENCOUNTER — Ambulatory Visit (INDEPENDENT_AMBULATORY_CARE_PROVIDER_SITE_OTHER): Payer: Medicaid Other | Admitting: Adult Health

## 2020-06-28 ENCOUNTER — Encounter: Payer: Self-pay | Admitting: Adult Health

## 2020-06-28 VITALS — BP 106/72 | HR 79 | Ht 62.0 in | Wt 231.5 lb

## 2020-06-28 DIAGNOSIS — Z113 Encounter for screening for infections with a predominantly sexual mode of transmission: Secondary | ICD-10-CM | POA: Diagnosis not present

## 2020-06-28 DIAGNOSIS — N926 Irregular menstruation, unspecified: Secondary | ICD-10-CM | POA: Diagnosis not present

## 2020-06-28 DIAGNOSIS — Z3202 Encounter for pregnancy test, result negative: Secondary | ICD-10-CM | POA: Diagnosis not present

## 2020-06-28 DIAGNOSIS — Z30011 Encounter for initial prescription of contraceptive pills: Secondary | ICD-10-CM | POA: Diagnosis not present

## 2020-06-28 LAB — POCT URINE PREGNANCY: Preg Test, Ur: NEGATIVE

## 2020-06-28 MED ORDER — NORETHIN ACE-ETH ESTRAD-FE 1-20 MG-MCG PO TABS: 1.0000 | ORAL_TABLET | Freq: Every day | ORAL | 11 refills | Status: DC

## 2020-06-28 NOTE — Progress Notes (Signed)
  Subjective:     Patient ID: Cynthia Duarte, female   DOB: [birth year], 19 y.o.   MRN: [id number]  HPI Cynthia Duarte is a 19 year old black female, single, G2P0011, in requesting STD testing and says cycles are irregular, having them but comes at different times.  PCP is Dr Conni Elliot.  Review of Systems +irregular cycles Denies any vaginal discharge, itching or burning  Reviewed past medical,surgical, social and family history. Reviewed medications and allergies.     Objective:   Physical Exam BP 106/72 (BP Location: Left Arm, Patient Position: Sitting, Cuff Size: Large)   Pulse 79   Ht 5\' 2"  (1.575 m)   Wt 231 lb 8 oz (105 kg)   LMP 06/14/2020   Breastfeeding No   BMI 42.34 kg/m UPT is negative. Skin warm and dry.Pelvic: external genitalia is normal in appearance no lesions, vagina:scant discharge without odor,urethra has no lesions or masses noted, cervix:smooth and bulbous, uterus: normal size, shape and contour, non tender, no masses felt, adnexa: no masses or tenderness noted. Bladder is non tender and no masses felt.  CV swab obtained.   Upstream - 06/28/20 1012      Pregnancy Intention Screening   Does the patient want to become pregnant in the next year? No    Does the patient's partner want to become pregnant in the next year? No    Would the patient like to discuss contraceptive options today? Yes      Contraception Wrap Up   Current Method Female Condom    End Method Female Condom;Oral Contraceptive    Contraception Counseling Provided Yes            Examination chaperoned by 06/30/20 LPN   Assessment:     1. Pregnancy examination or test, negative result   2. Screening examination for STD (sexually transmitted disease) CV swab sent Check HIV, RPR and hepatitis C antibody   3. Irregular menstrual cycle Will try OCs to regulate   4. Encounter for initial prescription of contraceptive pills Will rx Junel 1/20 start today, use condoms  Meds ordered this  encounter  Medications  . norethindrone-ethinyl estradiol (LOESTRIN FE) 1-20 MG-MCG tablet    Sig: Take 1 tablet by mouth daily.    Dispense:  28 tablet    Refill:  11    Order Specific Question:   Supervising Provider    Answer:   2/20 [[id number]]      Plan:     Return 10/13/20 for ROS, First pap and physical

## 2020-06-29 LAB — CERVICOVAGINAL ANCILLARY ONLY
Bacterial Vaginitis (gardnerella): NEGATIVE
Candida Glabrata: NEGATIVE
Candida Vaginitis: NEGATIVE
Chlamydia: NEGATIVE
Comment: NEGATIVE
Comment: NEGATIVE
Comment: NEGATIVE
Comment: NEGATIVE
Comment: NEGATIVE
Comment: NORMAL
Neisseria Gonorrhea: NEGATIVE
Trichomonas: NEGATIVE

## 2020-06-29 LAB — RPR: RPR Ser Ql: NONREACTIVE

## 2020-06-29 LAB — HEPATITIS C ANTIBODY: Hep C Virus Ab: <0.1 s/co ratio (ref 0.0–0.9)

## 2020-06-29 LAB — HIV ANTIBODY (ROUTINE TESTING W REFLEX): HIV Screen 4th Generation wRfx: NONREACTIVE

## 2020-07-29 DIAGNOSIS — Z111 Encounter for screening for respiratory tuberculosis: Secondary | ICD-10-CM | POA: Diagnosis not present

## 2020-08-11 ENCOUNTER — Ambulatory Visit (INDEPENDENT_AMBULATORY_CARE_PROVIDER_SITE_OTHER): Payer: Medicaid Other | Admitting: Adult Health

## 2020-08-11 ENCOUNTER — Encounter: Payer: Self-pay | Admitting: Adult Health

## 2020-08-11 VITALS — BP 115/75 | HR 72 | Ht 62.0 in | Wt 231.0 lb

## 2020-08-11 DIAGNOSIS — N926 Irregular menstruation, unspecified: Secondary | ICD-10-CM | POA: Diagnosis not present

## 2020-08-11 DIAGNOSIS — Z319 Encounter for procreative management, unspecified: Secondary | ICD-10-CM

## 2020-08-11 MED ORDER — PRENATAL PLUS 27-1 MG PO TABS: 1.0000 | ORAL_TABLET | Freq: Every day | ORAL | 12 refills | Status: DC

## 2020-08-11 NOTE — Progress Notes (Signed)
  Subjective:     Patient ID: Cynthia Duarte, female   DOB: 2001-02-24, 18 y.o.   MRN: [id number]  HPI [name] is a 19 year old black female,single, G2P0011 in complaining of irregular periods, and she di not start OCs in august as discussed and she wants to be pregnant. She thinks something is wrong since last miscarriage. PCP is Dr Conni Elliot  Review of Systems +irregular periods Reviewed past medical,surgical, social and family history. Reviewed medications and allergies.     Objective:   Physical Exam BP 115/75 (BP Location: Left Arm, Patient Position: Sitting, Cuff Size: Normal)   Pulse 72   Ht 5\' 2"  (1.575 m)   Wt 231 lb (104.8 kg)   LMP 08/09/2020   BMI 42.25 kg/m  Skin warm and dry.  Lungs: clear to ausculation bilaterally. Cardiovascular: regular rate and rhythm.  Upstream - 08/11/20 1150      Pregnancy Intention Screening   Does the patient want to become pregnant in the next year? Yes    Does the patient's partner want to become pregnant in the next year? Yes    Would the patient like to discuss contraceptive options today? No      Contraception Wrap Up   Current Method Pregnant/Seeking Pregnancy    End Method Pregnant/Seeking Pregnancy             Assessment:     1. Irregular menstrual cycle Will get 08/13/20 10/21 at 1:30 pm at [facility] to assess uterus and ovaries  Start OCs Sunday   2. Patient desires pregnancy Take PNV    Plan:     Return in December for pap and physical and ROS.

## 2020-08-19 ENCOUNTER — Other Ambulatory Visit: Payer: Self-pay

## 2020-08-19 ENCOUNTER — Ambulatory Visit (HOSPITAL_COMMUNITY)
Admission: RE | Admit: 2020-08-19 | Discharge: 2020-08-19 | Disposition: A | Discharge status: Home / Self Care | Payer: Medicaid Other | Source: Ambulatory Visit | Attending: Adult Health | Admitting: Adult Health

## 2020-08-19 DIAGNOSIS — N926 Irregular menstruation, unspecified: Secondary | ICD-10-CM | POA: Insufficient documentation

## 2020-09-28 ENCOUNTER — Telehealth: Payer: Self-pay | Admitting: Adult Health

## 2020-09-28 NOTE — Telephone Encounter (Signed)
Left message @ 11:14 am. JSY

## 2020-09-28 NOTE — Telephone Encounter (Signed)
Patient called stating that she would like to know if Victorino Dike could call her in something for her infection. Pt states that her URine smells like fish when she uses the restroom. Please conatct pt

## 2020-09-29 ENCOUNTER — Other Ambulatory Visit: Payer: Self-pay | Admitting: Adult Health

## 2020-09-29 MED ORDER — METRONIDAZOLE 500 MG PO TABS: 500.0000 mg | ORAL_TABLET | Freq: Two times a day (BID) | ORAL | 0 refills | Status: DC

## 2020-09-29 NOTE — Progress Notes (Signed)
rx flagyl  

## 2020-10-13 ENCOUNTER — Other Ambulatory Visit: Payer: Medicaid Other | Admitting: Adult Health

## 2020-10-30 DIAGNOSIS — R079 Chest pain, unspecified: Secondary | ICD-10-CM | POA: Diagnosis not present

## 2020-10-30 DIAGNOSIS — R0600 Dyspnea, unspecified: Secondary | ICD-10-CM | POA: Diagnosis not present

## 2020-10-30 DIAGNOSIS — R0789 Other chest pain: Secondary | ICD-10-CM | POA: Diagnosis not present

## 2020-10-30 DIAGNOSIS — R791 Abnormal coagulation profile: Secondary | ICD-10-CM | POA: Diagnosis not present

## 2020-10-30 DIAGNOSIS — I2699 Other pulmonary embolism without acute cor pulmonale: Secondary | ICD-10-CM | POA: Diagnosis not present

## 2020-10-30 DIAGNOSIS — J209 Acute bronchitis, unspecified: Secondary | ICD-10-CM | POA: Diagnosis not present

## 2020-11-04 DIAGNOSIS — R0602 Shortness of breath: Secondary | ICD-10-CM | POA: Diagnosis not present

## 2020-11-04 DIAGNOSIS — R0789 Other chest pain: Secondary | ICD-10-CM | POA: Diagnosis not present

## 2020-11-04 DIAGNOSIS — R079 Chest pain, unspecified: Secondary | ICD-10-CM | POA: Diagnosis not present

## 2020-11-04 DIAGNOSIS — J45909 Unspecified asthma, uncomplicated: Secondary | ICD-10-CM | POA: Diagnosis not present

## 2020-11-16 DIAGNOSIS — N898 Other specified noninflammatory disorders of vagina: Secondary | ICD-10-CM | POA: Diagnosis not present

## 2020-11-25 ENCOUNTER — Telehealth: Payer: Self-pay

## 2020-11-25 NOTE — Telephone Encounter (Signed)
Pt passed a blood clot yesterday. She is not on her period. Pt is still spotting some. LMP was beginning of Jan. Pt is not on any birth control. Pt states she is unsure if she "had a miscarriage". Pt hasn't done a pregnancy test. Pt was advised to run a pregnancy test and let us know through MyChart what result is. Pt voiced understanding. JSY

## 2020-11-25 NOTE — Telephone Encounter (Signed)
Patient called stating she passed a large blood clot and she isn't on her menstrual cycle. Is wanting to speak to someone regarding this. CB # [phone]

## 2020-12-09 ENCOUNTER — Other Ambulatory Visit: Payer: Medicaid Other | Admitting: Adult Health

## 2021-01-03 DIAGNOSIS — L03211 Cellulitis of face: Secondary | ICD-10-CM | POA: Diagnosis not present

## 2021-01-16 DIAGNOSIS — F419 Anxiety disorder, unspecified: Secondary | ICD-10-CM | POA: Diagnosis not present

## 2021-01-16 DIAGNOSIS — E039 Hypothyroidism, unspecified: Secondary | ICD-10-CM | POA: Diagnosis not present

## 2021-01-16 DIAGNOSIS — R002 Palpitations: Secondary | ICD-10-CM | POA: Diagnosis not present

## 2021-01-18 ENCOUNTER — Telehealth: Payer: Self-pay | Admitting: *Deleted

## 2021-01-18 NOTE — Telephone Encounter (Signed)
Transition Care Management Unsuccessful Follow-up Telephone Call  Date of discharge and from where:  01-16-2021 [facility]  Attempts: 1st   Reason for unsuccessful TCM follow-up call:  Spoke with mom patient was resting  .   Will call back

## 2021-01-23 DIAGNOSIS — H109 Unspecified conjunctivitis: Secondary | ICD-10-CM | POA: Diagnosis not present

## 2021-01-23 DIAGNOSIS — J45909 Unspecified asthma, uncomplicated: Secondary | ICD-10-CM | POA: Diagnosis not present

## 2021-01-23 DIAGNOSIS — R21 Rash and other nonspecific skin eruption: Secondary | ICD-10-CM | POA: Diagnosis not present

## 2021-01-31 DIAGNOSIS — R140 Abdominal distension (gaseous): Secondary | ICD-10-CM | POA: Diagnosis not present

## 2021-01-31 DIAGNOSIS — R1013 Epigastric pain: Secondary | ICD-10-CM | POA: Diagnosis not present

## 2021-01-31 DIAGNOSIS — F419 Anxiety disorder, unspecified: Secondary | ICD-10-CM | POA: Diagnosis not present

## 2021-01-31 DIAGNOSIS — R002 Palpitations: Secondary | ICD-10-CM | POA: Diagnosis not present

## 2021-01-31 DIAGNOSIS — K219 Gastro-esophageal reflux disease without esophagitis: Secondary | ICD-10-CM | POA: Diagnosis not present

## 2021-02-03 DIAGNOSIS — R1013 Epigastric pain: Secondary | ICD-10-CM | POA: Diagnosis not present

## 2021-02-03 DIAGNOSIS — R110 Nausea: Secondary | ICD-10-CM | POA: Diagnosis not present

## 2021-02-03 DIAGNOSIS — D72829 Elevated white blood cell count, unspecified: Secondary | ICD-10-CM | POA: Diagnosis not present

## 2021-02-07 ENCOUNTER — Other Ambulatory Visit: Payer: Self-pay | Admitting: Adult Health

## 2021-02-07 DIAGNOSIS — Z3201 Encounter for pregnancy test, result positive: Secondary | ICD-10-CM

## 2021-02-07 NOTE — Progress Notes (Signed)
Ck QHCG  

## 2021-02-08 ENCOUNTER — Other Ambulatory Visit: Payer: Self-pay

## 2021-02-08 ENCOUNTER — Other Ambulatory Visit: Payer: Medicaid Other

## 2021-02-08 DIAGNOSIS — Z3201 Encounter for pregnancy test, result positive: Secondary | ICD-10-CM | POA: Diagnosis not present

## 2021-02-09 LAB — BETA HCG QUANT (REF LAB): hCG Quant: 551 m[IU]/mL

## 2021-02-22 ENCOUNTER — Encounter: Payer: Self-pay | Admitting: *Deleted

## 2021-03-02 DIAGNOSIS — N912 Amenorrhea, unspecified: Secondary | ICD-10-CM | POA: Diagnosis not present

## 2021-03-04 DIAGNOSIS — K219 Gastro-esophageal reflux disease without esophagitis: Secondary | ICD-10-CM | POA: Diagnosis not present

## 2021-03-04 DIAGNOSIS — R079 Chest pain, unspecified: Secondary | ICD-10-CM | POA: Diagnosis not present

## 2021-03-04 DIAGNOSIS — O99612 Diseases of the digestive system complicating pregnancy, second trimester: Secondary | ICD-10-CM | POA: Diagnosis not present

## 2021-03-04 DIAGNOSIS — R21 Rash and other nonspecific skin eruption: Secondary | ICD-10-CM | POA: Diagnosis not present

## 2021-03-04 DIAGNOSIS — O99891 Other specified diseases and conditions complicating pregnancy: Secondary | ICD-10-CM | POA: Diagnosis not present

## 2021-03-04 DIAGNOSIS — O99412 Diseases of the circulatory system complicating pregnancy, second trimester: Secondary | ICD-10-CM | POA: Diagnosis not present

## 2021-03-04 DIAGNOSIS — Z3A28 28 weeks gestation of pregnancy: Secondary | ICD-10-CM | POA: Diagnosis not present

## 2021-03-04 DIAGNOSIS — O99613 Diseases of the digestive system complicating pregnancy, third trimester: Secondary | ICD-10-CM | POA: Diagnosis not present

## 2021-03-07 ENCOUNTER — Encounter: Payer: Self-pay | Admitting: *Deleted

## 2021-03-07 ENCOUNTER — Telehealth: Payer: Self-pay

## 2021-03-07 NOTE — Telephone Encounter (Signed)
Transition Care Management Unsuccessful Follow-up Telephone Call  Date of discharge and from where:   [facility]   Attempts:  1st Attempt  Reason for unsuccessful TCM follow-up call:  Unable to leave message

## 2021-03-08 DIAGNOSIS — Z113 Encounter for screening for infections with a predominantly sexual mode of transmission: Secondary | ICD-10-CM | POA: Diagnosis not present

## 2021-03-08 DIAGNOSIS — Z130 Encounter for screening for diseases of the blood and blood-forming organs and certain disorders involving the immune mechanism: Secondary | ICD-10-CM | POA: Diagnosis not present

## 2021-03-08 DIAGNOSIS — Z369 Encounter for antenatal screening, unspecified: Secondary | ICD-10-CM | POA: Diagnosis not present

## 2021-03-08 DIAGNOSIS — O26841 Uterine size-date discrepancy, first trimester: Secondary | ICD-10-CM | POA: Diagnosis not present

## 2021-03-08 DIAGNOSIS — O219 Vomiting of pregnancy, unspecified: Secondary | ICD-10-CM | POA: Diagnosis not present

## 2021-03-08 DIAGNOSIS — O99210 Obesity complicating pregnancy, unspecified trimester: Secondary | ICD-10-CM | POA: Diagnosis not present

## 2021-03-08 DIAGNOSIS — Z124 Encounter for screening for malignant neoplasm of cervix: Secondary | ICD-10-CM | POA: Diagnosis not present

## 2021-03-08 DIAGNOSIS — Z1159 Encounter for screening for other viral diseases: Secondary | ICD-10-CM | POA: Diagnosis not present

## 2021-03-08 NOTE — Telephone Encounter (Signed)
Pt was seen at a non [facility] facility and her PCP is also non [facility].

## 2021-03-09 ENCOUNTER — Other Ambulatory Visit: Payer: Medicaid Other

## 2021-03-23 DIAGNOSIS — K5901 Slow transit constipation: Secondary | ICD-10-CM | POA: Diagnosis not present

## 2021-03-23 DIAGNOSIS — L2084 Intrinsic (allergic) eczema: Secondary | ICD-10-CM | POA: Diagnosis not present

## 2021-03-23 DIAGNOSIS — K219 Gastro-esophageal reflux disease without esophagitis: Secondary | ICD-10-CM | POA: Diagnosis not present

## 2021-04-05 DIAGNOSIS — Z3682 Encounter for antenatal screening for nuchal translucency: Secondary | ICD-10-CM | POA: Diagnosis not present

## 2021-05-03 DIAGNOSIS — Z3689 Encounter for other specified antenatal screening: Secondary | ICD-10-CM | POA: Diagnosis not present

## 2021-05-31 DIAGNOSIS — Z363 Encounter for antenatal screening for malformations: Secondary | ICD-10-CM | POA: Diagnosis not present

## 2021-06-27 DIAGNOSIS — Z3492 Encounter for supervision of normal pregnancy, unspecified, second trimester: Secondary | ICD-10-CM | POA: Diagnosis not present

## 2021-07-25 DIAGNOSIS — O99212 Obesity complicating pregnancy, second trimester: Secondary | ICD-10-CM | POA: Diagnosis not present

## 2021-07-25 DIAGNOSIS — Z23 Encounter for immunization: Secondary | ICD-10-CM | POA: Diagnosis not present

## 2021-07-25 DIAGNOSIS — Z3689 Encounter for other specified antenatal screening: Secondary | ICD-10-CM | POA: Diagnosis not present

## 2021-07-28 ENCOUNTER — Telehealth: Payer: Self-pay

## 2021-07-28 NOTE — Telephone Encounter (Signed)
..   Medicaid Managed Care   Unsuccessful Outreach Note  07/28/2021 Name: Cynthia Duarte MRN: [id number] DOB: [birth year]  Referred by: Medicine, [facility] Reason for referral : High Risk Managed Medicaid (I called the patient today to get her scheduled for a visit with the [facility] RNCM. I left my name and number on her VM.)   An unsuccessful telephone outreach was attempted today. The patient was referred to the case management team for assistance with care management and care coordination.   Follow Up Plan: The care management team will reach out to the patient again over the next 7-14 days.   Weston Settle Care Guide, High Risk Medicaid Managed Care Embedded Care Coordination [facility]  [facility]

## 2021-09-20 DIAGNOSIS — Z3A36 36 weeks gestation of pregnancy: Secondary | ICD-10-CM | POA: Diagnosis not present

## 2021-09-20 DIAGNOSIS — Z113 Encounter for screening for infections with a predominantly sexual mode of transmission: Secondary | ICD-10-CM | POA: Diagnosis not present

## 2021-09-20 DIAGNOSIS — Z369 Encounter for antenatal screening, unspecified: Secondary | ICD-10-CM | POA: Diagnosis not present

## 2021-09-20 DIAGNOSIS — O365990 Maternal care for other known or suspected poor fetal growth, unspecified trimester, not applicable or unspecified: Secondary | ICD-10-CM | POA: Diagnosis not present

## 2021-09-20 DIAGNOSIS — Z3A00 Weeks of gestation of pregnancy not specified: Secondary | ICD-10-CM | POA: Diagnosis not present

## 2021-09-20 DIAGNOSIS — Z3689 Encounter for other specified antenatal screening: Secondary | ICD-10-CM | POA: Diagnosis not present

## 2021-09-23 DIAGNOSIS — O365930 Maternal care for other known or suspected poor fetal growth, third trimester, not applicable or unspecified: Secondary | ICD-10-CM | POA: Diagnosis not present

## 2021-09-23 DIAGNOSIS — Z3A36 36 weeks gestation of pregnancy: Secondary | ICD-10-CM | POA: Diagnosis not present

## 2021-09-24 DIAGNOSIS — O4703 False labor before 37 completed weeks of gestation, third trimester: Secondary | ICD-10-CM | POA: Diagnosis not present

## 2021-09-24 DIAGNOSIS — Z3A36 36 weeks gestation of pregnancy: Secondary | ICD-10-CM | POA: Diagnosis not present

## 2021-09-28 DIAGNOSIS — O365930 Maternal care for other known or suspected poor fetal growth, third trimester, not applicable or unspecified: Secondary | ICD-10-CM | POA: Diagnosis not present

## 2021-09-28 DIAGNOSIS — Z3A37 37 weeks gestation of pregnancy: Secondary | ICD-10-CM | POA: Diagnosis not present

## 2021-09-28 DIAGNOSIS — O365931 Maternal care for other known or suspected poor fetal growth, third trimester, fetus 1: Secondary | ICD-10-CM | POA: Diagnosis not present

## 2021-10-01 DIAGNOSIS — O471 False labor at or after 37 completed weeks of gestation: Secondary | ICD-10-CM | POA: Diagnosis not present

## 2021-10-01 DIAGNOSIS — Z3A37 37 weeks gestation of pregnancy: Secondary | ICD-10-CM | POA: Diagnosis not present

## 2021-10-10 DIAGNOSIS — Z3A38 38 weeks gestation of pregnancy: Secondary | ICD-10-CM | POA: Diagnosis not present

## 2021-10-10 DIAGNOSIS — O9081 Anemia of the puerperium: Secondary | ICD-10-CM | POA: Diagnosis not present

## 2021-10-10 DIAGNOSIS — O365930 Maternal care for other known or suspected poor fetal growth, third trimester, not applicable or unspecified: Secondary | ICD-10-CM | POA: Diagnosis not present

## 2021-10-10 DIAGNOSIS — Z20822 Contact with and (suspected) exposure to covid-19: Secondary | ICD-10-CM | POA: Diagnosis not present

## 2021-12-23 DIAGNOSIS — R590 Localized enlarged lymph nodes: Secondary | ICD-10-CM | POA: Diagnosis not present

## 2021-12-26 ENCOUNTER — Other Ambulatory Visit: Payer: Self-pay

## 2021-12-26 MED ORDER — METRONIDAZOLE 500 MG PO TABS: 500.0000 mg | ORAL_TABLET | Freq: Two times a day (BID) | ORAL | 0 refills | Status: DC

## 2021-12-29 DIAGNOSIS — Z30017 Encounter for initial prescription of implantable subdermal contraceptive: Secondary | ICD-10-CM | POA: Diagnosis not present

## 2021-12-29 DIAGNOSIS — Z3202 Encounter for pregnancy test, result negative: Secondary | ICD-10-CM | POA: Diagnosis not present

## 2022-01-02 DIAGNOSIS — R87612 Low grade squamous intraepithelial lesion on cytologic smear of cervix (LGSIL): Secondary | ICD-10-CM | POA: Diagnosis not present

## 2022-01-03 DIAGNOSIS — R0789 Other chest pain: Secondary | ICD-10-CM | POA: Diagnosis not present

## 2022-01-03 DIAGNOSIS — H6122 Impacted cerumen, left ear: Secondary | ICD-10-CM | POA: Diagnosis not present

## 2022-01-03 DIAGNOSIS — R002 Palpitations: Secondary | ICD-10-CM | POA: Diagnosis not present

## 2022-02-01 ENCOUNTER — Encounter: Payer: Self-pay | Admitting: Adult Health

## 2022-02-01 ENCOUNTER — Ambulatory Visit (INDEPENDENT_AMBULATORY_CARE_PROVIDER_SITE_OTHER): Payer: Medicaid Other | Admitting: Adult Health

## 2022-02-01 VITALS — BP 105/72 | HR 85 | Ht 62.0 in | Wt 240.0 lb

## 2022-02-01 DIAGNOSIS — Z3202 Encounter for pregnancy test, result negative: Secondary | ICD-10-CM | POA: Diagnosis not present

## 2022-02-01 DIAGNOSIS — Z3046 Encounter for surveillance of implantable subdermal contraceptive: Secondary | ICD-10-CM | POA: Diagnosis not present

## 2022-02-01 DIAGNOSIS — Z113 Encounter for screening for infections with a predominantly sexual mode of transmission: Secondary | ICD-10-CM | POA: Insufficient documentation

## 2022-02-01 DIAGNOSIS — Z30011 Encounter for initial prescription of contraceptive pills: Secondary | ICD-10-CM

## 2022-02-01 LAB — POCT URINE PREGNANCY: Preg Test, Ur: NEGATIVE

## 2022-02-01 MED ORDER — DESOGESTREL-ETHINYL ESTRADIOL 0.15-30 MG-MCG PO TABS: 1.0000 | ORAL_TABLET | Freq: Every day | ORAL | 11 refills | Status: DC

## 2022-02-01 NOTE — Patient Instructions (Addendum)
Use condoms x 4 weeks, keep clean and dry x 24 hours, no heavy lifting, keep steri strips on x 72 hours, Keep pressure dressing on x 24 hours. Follow up prn problems.  ?Return in 8 weeks for pap and physical and ROS  ?

## 2022-02-01 NOTE — Progress Notes (Signed)
?  Subjective:  ?  ? Patient ID: Cynthia Duarte, female   DOB: October 10, 2001, 21 y.o.   MRN: [id number] ? ?HPI ?Cynthia Duarte is a 21 year old black female, single, [id number], in for nexplanon removal, wants the pill. She needs pap, will get scheduled.  ?PCP is [employer].  ? ?Review of Systems ?For nexplanon removal ?Was having headaches and nausea ?Reviewed past medical,surgical, social and family history. Reviewed medications and allergies.  ?   ?Objective:  ? Physical Exam ?BP 105/72 (BP Location: Left Arm, Patient Position: Sitting, Cuff Size: Large)   Pulse 85   Ht 5\' 2"  (1.575 m)   Wt 240 lb (108.9 kg)   LMP 01/25/2022 (Approximate)   BMI 43.90 kg/m?  UPT is negative. Consent signed, time out called. ?Right arm cleansed with betadine, and injected with 1.5 cc 2% lidocaine and waited til numb.Under sterile technique a #11 blade was used to make small vertical incision, and a curved forceps was used to easily remove rod. Steri strips applied. Pressure dressing applied.  ?  Fall risk is low ? Upstream - 02/01/22 0855   ? ?  ? Pregnancy Intention Screening  ? Does the patient want to become pregnant in the next year? No   ? Does the patient's partner want to become pregnant in the next year? No   ? Would the patient like to discuss contraceptive options today? Yes   ?  ? Contraception Wrap Up  ? Current Method Hormonal Implant   ? End Method Oral Contraceptive   ? ?  ?  ? ?  ?  ?Assessment:  ?   ?1. Pregnancy examination or test, negative result ? ?2. Screening for STD (sexually transmitted disease) ?Urine sent for GC/CL ? ?3. Encounter for Nexplanon removal ?Use condoms x 4 weeks, keep clean and dry x 24 hours, no heavy lifting, keep steri strips on x 72 hours, Keep pressure dressing on x 24 hours. Follow up prn problems.  ? ?4. Encounter for initial prescription of contraceptive pills ?Start OCs today and use condoms for 1 pack ?Meds ordered this encounter  ?Medications  ? desogestrel-ethinyl estradiol (APRI)  0.15-30 MG-MCG tablet  ?  Sig: Take 1 tablet by mouth daily.  ?  Dispense:  28 tablet  ?  Refill:  11  ?  Order Specific Question:   Supervising Provider  ?  Answer:   03-07-2004 H [[id number]]  ?  ?   ?Plan:  ?   ?Return in 8 weeks for pap and physical and ROS on COCs ?   ?

## 2022-02-02 LAB — GC/CHLAMYDIA PROBE AMP
Chlamydia trachomatis, NAA: NEGATIVE
Neisseria Gonorrhoeae by PCR: NEGATIVE

## 2022-02-17 DIAGNOSIS — H5213 Myopia, bilateral: Secondary | ICD-10-CM | POA: Diagnosis not present

## 2022-02-24 DIAGNOSIS — Z13220 Encounter for screening for lipoid disorders: Secondary | ICD-10-CM | POA: Diagnosis not present

## 2022-02-24 DIAGNOSIS — Z0000 Encounter for general adult medical examination without abnormal findings: Secondary | ICD-10-CM | POA: Diagnosis not present

## 2022-02-24 DIAGNOSIS — Z6841 Body Mass Index (BMI) 40.0 and over, adult: Secondary | ICD-10-CM | POA: Diagnosis not present

## 2022-02-24 DIAGNOSIS — F411 Generalized anxiety disorder: Secondary | ICD-10-CM | POA: Diagnosis not present

## 2022-02-24 DIAGNOSIS — J4520 Mild intermittent asthma, uncomplicated: Secondary | ICD-10-CM | POA: Diagnosis not present

## 2022-03-01 ENCOUNTER — Other Ambulatory Visit (HOSPITAL_COMMUNITY)
Admission: RE | Admit: 2022-03-01 | Discharge: 2022-03-01 | Disposition: A | Discharge status: Home / Self Care | Payer: Medicaid Other | Source: Ambulatory Visit | Attending: Adult Health | Admitting: Adult Health

## 2022-03-01 DIAGNOSIS — Z01419 Encounter for gynecological examination (general) (routine) without abnormal findings: Secondary | ICD-10-CM | POA: Insufficient documentation

## 2022-03-29 ENCOUNTER — Ambulatory Visit (INDEPENDENT_AMBULATORY_CARE_PROVIDER_SITE_OTHER): Payer: Medicaid Other | Admitting: Adult Health

## 2022-03-29 ENCOUNTER — Encounter: Payer: Self-pay | Admitting: Adult Health

## 2022-03-29 VITALS — BP 109/78 | HR 86 | Ht 62.0 in | Wt 239.0 lb

## 2022-03-29 DIAGNOSIS — Z8742 Personal history of other diseases of the female genital tract: Secondary | ICD-10-CM

## 2022-03-29 DIAGNOSIS — Z01419 Encounter for gynecological examination (general) (routine) without abnormal findings: Secondary | ICD-10-CM

## 2022-03-29 NOTE — Progress Notes (Signed)
Patient ID: Cynthia Duarte, female   DOB: 06-Jan-2001, 21 y.o.   MRN: [id number] History of Present Illness: Cynthia Duarte is a 21 year old black female,single, [id number], in for well woman gyn exam and pap. Since she had nexplanon removed in April, no nausea or headaches.Doing good on OCs.  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  PCP is [employer].    Current Medications, Allergies, Past Medical History, Past Surgical History, Family History and Social History were reviewed in [employer] record.     Review of Systems: Patient denies any headaches, hearing loss, fatigue, blurred vision, shortness of breath, chest pain, abdominal pain, problems with bowel movements, urination, or intercourse. No joint pain or mood swings.     Physical Exam:BP 109/78 (BP Location: Right Arm, Patient Position: Sitting, Cuff Size: Normal)   Pulse 86   Ht 5\' 2"  (1.575 m)   Wt 239 lb (108.4 kg)   LMP 03/25/2022   Breastfeeding No   BMI 43.71 kg/m   General:  Well developed, well nourished, no acute distress Skin:  Warm and dry Neck:  Midline trachea, normal thyroid, good ROM, no lymphadenopathy Lungs; Clear to auscultation bilaterally Breast:  No dominant palpable mass, retraction, or nipple discharge Cardiovascular: Regular rate and rhythm Abdomen:  Soft, non tender, no hepatosplenomegaly Pelvic:  External genitalia is normal in appearance, no lesions.  The vagina is normal in appearance. Urethra has no lesions or masses. The cervix is bulbous. Pap with GC/CHL performed. Uterus is felt to be normal size, shape, and contour.  No adnexal masses or tenderness noted.Bladder is non tender, no masses felt. Extremities/musculoskeletal:  No swelling or varicosities noted, no clubbing or cyanosis Psych:  No mood changes, alert and cooperative,seems happy AA is 0 Fall risk is low    03/29/2022    3:31 PM 03/18/2020    3:30 PM 10/15/2018    4:11 PM  Depression screen PHQ 2/9   Decreased Interest 0 0 0  Down, Depressed, Hopeless 0 0 0  PHQ - 2 Score 0 0 0  Altered sleeping 0    Tired, decreased energy 0    Change in appetite 0    Feeling bad or failure about yourself  0    Trouble concentrating 0    Moving slowly or fidgety/restless 0    Suicidal thoughts 0    PHQ-9 Score 0         03/29/2022    3:31 PM  GAD 7 : Generalized Anxiety Score  Nervous, Anxious, on Edge 1  Control/stop worrying 1  Worry too much - different things 0  Trouble relaxing 0  Restless 0  Easily annoyed or irritable 0  Afraid - awful might happen 0  Total GAD 7 Score 2      Upstream - 03/29/22 1528       Pregnancy Intention Screening   Does the patient want to become pregnant in the next year? No    Does the patient's partner want to become pregnant in the next year? No    Would the patient like to discuss contraceptive options today? No      Contraception Wrap Up   Current Method Oral Contraceptive    End Method Oral Contraceptive    Contraception Counseling Provided No            Examination chaperoned by 03/31/22 LPN  Impression and Plan:  1. Encounter for gynecological examination with Papanicolaou smear of cervix  Pap sent Physical in 1 year Pap in 3 if normal Continue Apri, has refills  2. 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

## 2022-04-04 ENCOUNTER — Telehealth: Payer: Self-pay | Admitting: Adult Health

## 2022-04-04 LAB — CYTOLOGY - PAP
Chlamydia: POSITIVE — AB
Comment: NEGATIVE
Comment: NEGATIVE
Comment: NORMAL
Diagnosis: UNDETERMINED — AB
High risk HPV: NEGATIVE
Neisseria Gonorrhea: NEGATIVE

## 2022-04-04 NOTE — Telephone Encounter (Signed)
Patient calling wanting to know if someone will call her about her results of her pap smear

## 2022-04-05 ENCOUNTER — Encounter: Payer: Self-pay | Admitting: Adult Health

## 2022-04-05 ENCOUNTER — Other Ambulatory Visit: Payer: Self-pay | Admitting: Adult Health

## 2022-04-05 DIAGNOSIS — R87610 Atypical squamous cells of undetermined significance on cytologic smear of cervix (ASC-US): Secondary | ICD-10-CM

## 2022-04-05 DIAGNOSIS — A749 Chlamydial infection, unspecified: Secondary | ICD-10-CM

## 2022-04-05 HISTORY — DX: Atypical squamous cells of undetermined significance on cytologic smear of cervix (ASC-US): R87.610

## 2022-04-05 HISTORY — DX: Chlamydial infection, unspecified: A74.9

## 2022-04-05 MED ORDER — DOXYCYCLINE HYCLATE 100 MG PO TABS: 100.0000 mg | ORAL_TABLET | Freq: Two times a day (BID) | ORAL | 0 refills | Status: DC

## 2022-04-05 NOTE — Progress Notes (Signed)
Will rx chlamydia for +Chlamydia

## 2022-05-04 ENCOUNTER — Other Ambulatory Visit: Payer: Medicaid Other

## 2022-05-05 ENCOUNTER — Other Ambulatory Visit: Payer: Medicaid Other

## 2022-05-10 ENCOUNTER — Other Ambulatory Visit: Payer: Medicaid Other

## 2022-05-18 DIAGNOSIS — R87612 Low grade squamous intraepithelial lesion on cytologic smear of cervix (LGSIL): Secondary | ICD-10-CM | POA: Diagnosis not present

## 2022-05-25 DIAGNOSIS — S6392XA Sprain of unspecified part of left wrist and hand, initial encounter: Secondary | ICD-10-CM | POA: Diagnosis not present

## 2022-05-25 DIAGNOSIS — S63502A Unspecified sprain of left wrist, initial encounter: Secondary | ICD-10-CM | POA: Diagnosis not present

## 2022-05-25 DIAGNOSIS — M25532 Pain in left wrist: Secondary | ICD-10-CM | POA: Diagnosis not present

## 2022-06-27 ENCOUNTER — Encounter: Payer: Self-pay | Admitting: *Deleted

## 2022-06-27 ENCOUNTER — Other Ambulatory Visit: Payer: Self-pay | Admitting: Adult Health

## 2022-06-27 ENCOUNTER — Ambulatory Visit (INDEPENDENT_AMBULATORY_CARE_PROVIDER_SITE_OTHER): Payer: Medicaid Other | Admitting: *Deleted

## 2022-06-27 DIAGNOSIS — Z3201 Encounter for pregnancy test, result positive: Secondary | ICD-10-CM | POA: Diagnosis not present

## 2022-06-27 LAB — POCT URINE PREGNANCY: Preg Test, Ur: POSITIVE — AB

## 2022-06-27 NOTE — Progress Notes (Signed)
   NURSE VISIT- PREGNANCY CONFIRMATION   SUBJECTIVE:  Cynthia Duarte is a 21 y.o. [phone] female at [redacted]w[redacted]d by certain LMP of Patient's last menstrual period was 06/01/2022 (exact date). Here for pregnancy confirmation.  Home pregnancy test: positive x 2   She reports no complaints.  She is not taking prenatal vitamins.    OBJECTIVE:  LMP 06/01/2022 (Exact Date)   Breastfeeding No   Appears well, in no apparent distress  Results for orders placed or performed in visit on 06/27/22 (from the past 24 hour(s))  POCT urine pregnancy   Collection Time: 06/27/22 10:58 AM  Result Value Ref Range   Preg Test, Ur Positive (A) Negative    ASSESSMENT: Positive pregnancy test, [redacted]w[redacted]d by LMP    PLAN: Schedule for dating ultrasound in 4-5 weeks Prenatal vitamins: plans to begin OTC ASAP   Nausea medicines: not currently needed   OB packet given: Yes  Annamarie Dawley  06/27/2022 11:00 AM

## 2022-07-17 DIAGNOSIS — G4452 New daily persistent headache (NDPH): Secondary | ICD-10-CM | POA: Diagnosis not present

## 2022-07-24 ENCOUNTER — Other Ambulatory Visit: Payer: Self-pay | Admitting: Obstetrics & Gynecology

## 2022-07-24 DIAGNOSIS — O34219 Maternal care for unspecified type scar from previous cesarean delivery: Secondary | ICD-10-CM | POA: Diagnosis not present

## 2022-07-24 DIAGNOSIS — O219 Vomiting of pregnancy, unspecified: Secondary | ICD-10-CM | POA: Diagnosis not present

## 2022-07-24 DIAGNOSIS — Z3201 Encounter for pregnancy test, result positive: Secondary | ICD-10-CM | POA: Diagnosis not present

## 2022-07-24 DIAGNOSIS — O468X1 Other antepartum hemorrhage, first trimester: Secondary | ICD-10-CM | POA: Diagnosis not present

## 2022-07-24 DIAGNOSIS — O99210 Obesity complicating pregnancy, unspecified trimester: Secondary | ICD-10-CM | POA: Diagnosis not present

## 2022-07-24 DIAGNOSIS — Z3689 Encounter for other specified antenatal screening: Secondary | ICD-10-CM | POA: Diagnosis not present

## 2022-07-24 DIAGNOSIS — O418X10 Other specified disorders of amniotic fluid and membranes, first trimester, not applicable or unspecified: Secondary | ICD-10-CM | POA: Diagnosis not present

## 2022-07-24 DIAGNOSIS — O09891 Supervision of other high risk pregnancies, first trimester: Secondary | ICD-10-CM | POA: Diagnosis not present

## 2022-07-24 DIAGNOSIS — O3680X0 Pregnancy with inconclusive fetal viability, not applicable or unspecified: Secondary | ICD-10-CM

## 2022-07-24 DIAGNOSIS — N912 Amenorrhea, unspecified: Secondary | ICD-10-CM | POA: Diagnosis not present

## 2022-07-25 ENCOUNTER — Other Ambulatory Visit: Payer: Medicaid Other

## 2022-08-07 DIAGNOSIS — O418X10 Other specified disorders of amniotic fluid and membranes, first trimester, not applicable or unspecified: Secondary | ICD-10-CM | POA: Diagnosis not present

## 2022-08-07 DIAGNOSIS — O468X1 Other antepartum hemorrhage, first trimester: Secondary | ICD-10-CM | POA: Diagnosis not present

## 2023-03-31 ENCOUNTER — Encounter (HOSPITAL_COMMUNITY): Payer: Self-pay | Admitting: Emergency Medicine

## 2023-03-31 ENCOUNTER — Emergency Department (HOSPITAL_COMMUNITY)
Admission: EM | Admit: 2023-03-31 | Discharge: 2023-03-31 | Disposition: A | Discharge status: Home / Self Care | Payer: Medicaid Other | Attending: Student | Admitting: Student

## 2023-03-31 ENCOUNTER — Other Ambulatory Visit: Payer: Self-pay

## 2023-03-31 DIAGNOSIS — Z30432 Encounter for removal of intrauterine contraceptive device: Secondary | ICD-10-CM

## 2023-03-31 DIAGNOSIS — N939 Abnormal uterine and vaginal bleeding, unspecified: Secondary | ICD-10-CM | POA: Diagnosis present

## 2023-03-31 LAB — CBC
HCT: 37.1 % (ref 36.0–46.0)
Hemoglobin: 12 g/dL (ref 12.0–15.0)
MCH: 28 pg (ref 26.0–34.0)
MCHC: 32.3 g/dL (ref 30.0–36.0)
MCV: 86.5 fL (ref 80.0–100.0)
Platelets: 319 10*3/uL (ref 150–400)
RBC: 4.29 MIL/uL (ref 3.87–5.11)
RDW: 15.3 % (ref 11.5–15.5)
WBC: 10.9 10*3/uL — ABNORMAL HIGH (ref 4.0–10.5)
nRBC: 0 % (ref 0.0–0.2)

## 2023-03-31 NOTE — ED Triage Notes (Signed)
Pt states she just had an IUD inserted a few days ago and now the strings are coming out and she has heavy vaginal bleeding and cramping. Pain rated 7/10

## 2023-03-31 NOTE — Discharge Instructions (Signed)
You were seen in the ED for concerns of vaginal bleeding. On examination, there was evident bleeding coming through the cervical opening. With consent, we removed the IUD as your bleeding and cramping began 2 days after IUD placement. Please discuss with your OBGYN regarding contraceptive options and please use some from of contraception such as condoms until you begin any other form of birth control as your fertility will rebound quickly and you will have a high chance of pregnancy again if no contraception is used.

## 2023-03-31 NOTE — ED Provider Notes (Signed)
[place] EMERGENCY DEPARTMENT AT [facility] Provider Note   CSN: [id number] Arrival date & time: 03/31/23  1207     History Chief Complaint  Patient presents with   Vaginal Bleeding    Cynthia Duarte is a 22 y.o. female.  Patient presents to the emergency department complaint of vaginal bleeding.  She reports that she had an IUD placed 2 days ago. States that she delivered child without complications about 6 weeks ago and has not experienced any significant vaginal bleeding since then. Denies any chance of pregnancy at this time, but is currently sexually active.    Vaginal Bleeding      Home Medications Prior to Admission medications   Medication Sig Start Date End Date Taking? Authorizing Provider  albuterol (VENTOLIN HFA) 108 (90 Base) MCG/ACT inhaler Inhale into the lungs. 02/11/20   [provider]  Prenatal 27-1 MG TABS TAKE 1 TABLET BY MOUTH DAILY AT 12 NOON 06/27/22   Adline Potter, NP      Allergies    Omnicef [cefdinir] and Amoxicillin-pot clavulanate    Review of Systems   Review of Systems  Genitourinary:  Positive for vaginal bleeding.  All other systems reviewed and are negative.   Physical Exam Updated Vital Signs BP 129/84 (BP Location: Right Arm)   Pulse 69   Temp 98.7 F (37.1 C) (Oral)   Resp 16   Ht 5\' 2"  (1.575 m)   Wt 102.5 kg   LMP 06/01/2022 (Exact Date)   SpO2 100%   Breastfeeding No   BMI 41.34 kg/m  Physical Exam Vitals and nursing note reviewed. Exam conducted with a chaperone present.  Constitutional:      General: She is not in acute distress.    Appearance: She is well-developed.  HENT:     Head: Normocephalic and atraumatic.  Eyes:     Conjunctiva/sclera: Conjunctivae normal.  Cardiovascular:     Rate and Rhythm: Normal rate and regular rhythm.     Heart sounds: No murmur heard. Pulmonary:     Effort: Pulmonary effort is normal. No respiratory distress.     Breath sounds: Normal breath  sounds.  Abdominal:     Palpations: Abdomen is soft.     Tenderness: There is no abdominal tenderness.  Genitourinary:    General: Normal vulva.     Exam position: Supine.     Vagina: Normal. No vaginal discharge.     Cervix: Cervical bleeding present.     Uterus: Tender. Not enlarged and no uterine prolapse.      Comments: Bleeding coming from cervical os, but at slow rate. IUD strings visible and IUD appears to be intact. Musculoskeletal:        General: No swelling.     Cervical back: Neck supple.  Skin:    General: Skin is warm and dry.     Capillary Refill: Capillary refill takes less than 2 seconds.  Neurological:     Mental Status: She is alert.  Psychiatric:        Mood and Affect: Mood normal.     ED Results / Procedures / Treatments   Labs (all labs ordered are listed, but only abnormal results are displayed) Labs Reviewed  CBC - Abnormal; Notable for the following components:      Result Value   WBC 10.9 (*)    All other components within normal limits    EKG None  Radiology No results found.  Procedures .Foreign Body Removal  Date/Time:  03/31/2023 3:35 PM  Performed by: Smitty Knudsen, PA-C Authorized by: Smitty Knudsen, PA-C  Consent: Verbal consent obtained. Risks and benefits: risks, benefits and alternatives were discussed Consent given by: patient Patient understanding: patient states understanding of the procedure being performed Patient consent: the patient's understanding of the procedure matches consent given Procedure consent: procedure consent matches procedure scheduled Patient identity confirmed: verbally with patient Body area: vagina  Sedation: Patient sedated: no  Patient restrained: no Patient cooperative: yes 1 objects recovered. Objects recovered: IUD Post-procedure assessment: foreign body removed Patient tolerance: patient tolerated the procedure well with no immediate complications     Medications Ordered in  ED Medications - No data to display  ED Course/ Medical Decision Making/ A&P                           Medical Decision Making Amount and/or Complexity of Data Reviewed Labs: ordered.   This patient presents to the ED for concern of vaginal bleeding. Differential diagnosis includes AUB, vaginal irritation, STI, UTI   Lab Tests:  I Ordered, and personally interpreted labs.  The pertinent results include: CBC with notable mild leukocytosis at 10.9 but no obvious signs of anemia   Problem List / ED Course:  Patient presents to the emergency department complaints of vaginal bleeding.  She reports that she had IUD placed about 2 days ago.  Patient is currently about 6 weeks postpartum.  Based on discussion with patient, if high suspicion that patient's vaginal bleeding is likely due to IUD placement.  Patient not currently anemic based on CBC results.  Advised patient contraceptive options and whether it would be advisable to have IUD removed at this time or not.  After discussion with patient, patient would prefer to have IUD removed and will follow-up with OB/GYN to discuss contraceptive options.  IUD removal performed without any complications.  No obvious signs of hemorrhaging occurring at this time believe the patient is safe for discharge and follow-up outpatient with OB/GYN.  Encourage patient to return to the emergency department if she has any acute worsening symptoms or begins to become excessively fatigued, lightheaded. Patient will follow up with Cyril Mourning, NP.  I believe the patient is currently stable for discharge home and outpatient follow-up at this time.  Patient is agreeable to treatment plan verbalized understanding all return precautions.  All questions answered prior to patient discharge.   Final Clinical Impression(s) / ED Diagnoses Final diagnoses:  Abnormal uterine bleeding  Encounter for IUD removal    Rx / DC Orders ED Discharge Orders     None          Smitty Knudsen, PA-C 03/31/23 1540    Kommor, [place], MD 04/01/23 [phone]

## 2023-08-20 ENCOUNTER — Telehealth: Payer: Medicaid Other

## 2023-08-20 DIAGNOSIS — N760 Acute vaginitis: Secondary | ICD-10-CM | POA: Diagnosis not present

## 2023-08-20 DIAGNOSIS — B9689 Other specified bacterial agents as the cause of diseases classified elsewhere: Secondary | ICD-10-CM

## 2023-08-21 MED ORDER — METRONIDAZOLE 0.75 % EX GEL
CUTANEOUS | 0 refills | Status: AC
Start: 2023-08-21 — End: ?

## 2023-08-21 NOTE — Progress Notes (Signed)

## 2023-08-21 NOTE — Progress Notes (Signed)
I have spent 5 minutes in review of e-visit questionnaire, review and updating patient chart, medical decision making and response to patient.   Mia Milan Cody Jacklynn Dehaas, PA-C    

## 2023-08-27 ENCOUNTER — Ambulatory Visit: Payer: Medicaid Other | Admitting: Adult Health

## 2023-10-08 ENCOUNTER — Ambulatory Visit: Payer: Medicaid Other | Admitting: Adult Health

## 2023-10-12 ENCOUNTER — Ambulatory Visit
Admission: EM | Admit: 2023-10-12 | Discharge: 2023-10-12 | Disposition: A | Discharge status: Home / Self Care | Payer: Medicaid Other | Attending: Family Medicine | Admitting: Family Medicine

## 2023-10-12 DIAGNOSIS — J069 Acute upper respiratory infection, unspecified: Secondary | ICD-10-CM | POA: Diagnosis not present

## 2023-10-12 LAB — POC COVID19/FLU A&B COMBO
Covid Antigen, POC: NEGATIVE
Influenza A Antigen, POC: NEGATIVE
Influenza B Antigen, POC: NEGATIVE

## 2023-10-12 LAB — POCT RAPID STREP A (OFFICE): Rapid Strep A Screen: NEGATIVE

## 2023-10-12 MED ORDER — LIDOCAINE VISCOUS HCL 2 % MT SOLN: 10.0000 mL | OROMUCOSAL | 0 refills | Status: AC | PRN

## 2023-10-12 MED ORDER — PROMETHAZINE-DM 6.25-15 MG/5ML PO SYRP: 5.0000 mL | ORAL_SOLUTION | Freq: Four times a day (QID) | ORAL | 0 refills | Status: AC | PRN

## 2023-10-12 NOTE — ED Provider Notes (Signed)
[facility]    CSN: [id number] Arrival date & time: 10/12/23  1621      History   Chief Complaint No chief complaint on file.   HPI Cynthia Duarte is a 22 y.o. female.   Presenting today with 1 day history of sore throat, headache, fever, chills, bilateral ear pressure and pain, nausea, abdominal pain.  Denies chest pain, shortness of breath, vomiting, diarrhea, rashes.  No known sick contacts recently.  Trying over-the-counter remedies with minimal relief.    Past Medical History:  Diagnosis Date   ASCUS of cervix with negative high risk HPV 04/05/2022   04/05/22 repeat pap in 1 year    Asthma    Chlamydia 04/05/2022   04/05/22 treated with doxycycline    Left ovarian cyst 11/12/2014   Lower abdominal pain 07/24/2014   Nexplanon in place 07/29/2015   Obesity    RLQ abdominal pain 08/22/2013   ? Cyst will try motrin and follow up    Patient Active Problem List   Diagnosis Date Noted   ASCUS of cervix with negative high risk HPV 04/05/2022   Chlamydia 04/05/2022   History of abnormal cervical Pap smear 03/29/2022   Encounter for gynecological examination with Papanicolaou smear of cervix 03/29/2022   Encounter for Nexplanon removal 02/01/2022   Screening for STD (sexually transmitted disease) 02/01/2022   Irregular menstrual cycle 06/28/2020   Screening examination for STD (sexually transmitted disease) 06/28/2020   Pregnancy examination or test, negative result 06/28/2020   BV (bacterial vaginosis) 03/18/2020   Vaginal odor 03/18/2020   Vaginal discharge 03/18/2020   Patient desires pregnancy 03/18/2020   Encounter to determine fetal viability of pregnancy 10/15/2018   Less than [redacted] weeks gestation of pregnancy 10/15/2018   Pregnancy test positive 10/15/2018   Urinary tract infection without hematuria 10/15/2018   Menstrual cramps 09/25/2018   Pregnancy test negative 09/25/2018   Encounter for initial prescription of contraceptive pills 09/25/2018   Left  ovarian cyst 11/12/2014   Lower abdominal pain 07/24/2014   RLQ abdominal pain 08/22/2013    Past Surgical History:  Procedure Laterality Date   CESAREAN SECTION  10/10/2021   TONSILLECTOMY      OB History     Gravida  4   Para  2   Term  2   Preterm      AB  1   Living  2      SAB  1   IAB      Ectopic      Multiple      Live Births  2            Home Medications    Prior to Admission medications   Medication Sig Start Date End Date Taking? Authorizing Provider  lidocaine (XYLOCAINE) 2 % solution Use as directed 10 mLs in the mouth or throat every 3 (three) hours as needed. 10/12/23  Yes Particia Nearing, PA-C  promethazine-dextromethorphan (PROMETHAZINE-DM) 6.25-15 MG/5ML syrup Take 5 mLs by mouth 4 (four) times daily as needed. 10/12/23  Yes Particia Nearing, PA-C  albuterol (VENTOLIN HFA) 108 (90 Base) MCG/ACT inhaler Inhale into the lungs. 02/11/20   [provider]  metroNIDAZOLE (METROGEL) 0.75 % gel Insert one applicatorful (5g) of medicine into the vagina once nightly x 5 days 08/21/23   Waldon Merl, PA-C  Prenatal 27-1 MG TABS TAKE 1 TABLET BY MOUTH DAILY AT 12 NOON 06/27/22   Adline Potter, NP    Family History  Family History  Problem Relation Age of Onset   Mental illness Paternal Grandfather    Mental illness Paternal Grandmother    Cancer Maternal Grandmother        cervical   Cancer Maternal Grandfather        lung   Diabetes Father    Hypertension Father    Other Mother        heart issues, left chamber don't pump like it's should   Diverticulitis Mother    Other Sister        heart murmur   Diabetes Maternal Aunt    COPD Maternal Aunt    Diabetes Paternal Aunt    Hypertension Paternal Aunt    Obesity Paternal Aunt    Heart disease Other        heart issues, maternal great grandpa    Social History Social History   Tobacco Use   Smoking status: Never   Smokeless tobacco: Never  Vaping  Use   Vaping status: Never Used  Substance Use Topics   Alcohol use: No   Drug use: No     Allergies   Omnicef [cefdinir] and Amoxicillin-pot clavulanate   Review of Systems Review of Systems Per HPI  Physical Exam Triage Vital Signs ED Triage Vitals  Encounter Vitals Group     BP 10/12/23 1655 107/74     Systolic BP Percentile --      Diastolic BP Percentile --      Pulse Rate 10/12/23 1655 (!) 114     Resp 10/12/23 1655 20     Temp 10/12/23 1655 99 F (37.2 C)     Temp Source 10/12/23 1655 Oral     SpO2 10/12/23 1655 97 %     Weight --      Height --      Head Circumference --      Peak Flow --      Pain Score 10/12/23 1656 5     Pain Loc --      Pain Education --      Exclude from Growth Chart --    No data found.  Updated Vital Signs BP 107/74 (BP Location: Right Arm)   Pulse (!) 114   Temp 99 F (37.2 C) (Oral)   Resp 20   LMP 10/02/2023   SpO2 97%   Visual Acuity Right Eye Distance:   Left Eye Distance:   Bilateral Distance:    Right Eye Near:   Left Eye Near:    Bilateral Near:     Physical Exam Vitals and nursing note reviewed.  Constitutional:      Appearance: Normal appearance.  HENT:     Head: Atraumatic.     Right Ear: Tympanic membrane and external ear normal.     Left Ear: Tympanic membrane and external ear normal.     Nose: Rhinorrhea present.     Mouth/Throat:     Mouth: Mucous membranes are moist.     Pharynx: Posterior oropharyngeal erythema present.  Eyes:     Extraocular Movements: Extraocular movements intact.     Conjunctiva/sclera: Conjunctivae normal.  Cardiovascular:     Rate and Rhythm: Normal rate and regular rhythm.     Heart sounds: Normal heart sounds.  Pulmonary:     Effort: Pulmonary effort is normal.     Breath sounds: Normal breath sounds. No wheezing or rales.  Musculoskeletal:        General: Normal range of motion.     Cervical  back: Normal range of motion and neck supple.  Skin:    General: Skin  is warm and dry.  Neurological:     Mental Status: She is alert and oriented to person, place, and time.  Psychiatric:        Mood and Affect: Mood normal.        Thought Content: Thought content normal.      UC Treatments / Results  Labs (all labs ordered are listed, but only abnormal results are displayed) Labs Reviewed  POCT RAPID STREP A (OFFICE)  POC COVID19/FLU A&B COMBO    EKG   Radiology No results found.  Procedures Procedures (including critical care time)  Medications Ordered in UC Medications - No data to display  Initial Impression / Assessment and Plan / UC Course  I have reviewed the triage vital signs and the nursing notes.  Pertinent labs & imaging results that were available during my care of the patient were reviewed by me and considered in my medical decision making (see chart for details).     Vitals and exam reassuring, rapid strep and rapid COVID and flu all negative.  Do suspect viral respiratory infection.  Treat with Phenergan DM, viscous lidocaine and supportive over-the-counter medications and home care.  Return for worsening symptoms.  Final Clinical Impressions(s) / UC Diagnoses   Final diagnoses:  Viral URI with cough   Discharge Instructions   None    ED Prescriptions     Medication Sig Dispense Auth. Provider   promethazine-dextromethorphan (PROMETHAZINE-DM) 6.25-15 MG/5ML syrup Take 5 mLs by mouth 4 (four) times daily as needed. 100 mL Particia Nearing, PA-C   lidocaine (XYLOCAINE) 2 % solution Use as directed 10 mLs in the mouth or throat every 3 (three) hours as needed. 100 mL Particia Nearing, [place]      PDMP not reviewed this encounter.   Particia Nearing, [place] 10/12/23 1749

## 2023-10-12 NOTE — ED Triage Notes (Signed)
Pt reports she hs throat pain, chills, headaches, fever (101), bilateral ear pain, nauseas, and abdominal pain x 1 day

## 2023-10-14 ENCOUNTER — Encounter (HOSPITAL_COMMUNITY): Payer: Self-pay

## 2023-10-14 ENCOUNTER — Other Ambulatory Visit: Payer: Self-pay

## 2023-10-14 ENCOUNTER — Emergency Department (HOSPITAL_COMMUNITY)
Admission: EM | Admit: 2023-10-14 | Discharge: 2023-10-14 | Disposition: A | Discharge status: Home / Self Care | Payer: Medicaid Other | Attending: Emergency Medicine | Admitting: Emergency Medicine

## 2023-10-14 DIAGNOSIS — J029 Acute pharyngitis, unspecified: Secondary | ICD-10-CM | POA: Diagnosis present

## 2023-10-14 DIAGNOSIS — B9789 Other viral agents as the cause of diseases classified elsewhere: Secondary | ICD-10-CM | POA: Diagnosis not present

## 2023-10-14 DIAGNOSIS — J028 Acute pharyngitis due to other specified organisms: Secondary | ICD-10-CM | POA: Insufficient documentation

## 2023-10-14 DIAGNOSIS — J45909 Unspecified asthma, uncomplicated: Secondary | ICD-10-CM | POA: Insufficient documentation

## 2023-10-14 DIAGNOSIS — J039 Acute tonsillitis, unspecified: Secondary | ICD-10-CM

## 2023-10-14 LAB — GROUP A STREP BY PCR: Group A Strep by PCR: NOT DETECTED

## 2023-10-14 MED ORDER — KETOROLAC TROMETHAMINE 30 MG/ML IJ SOLN
30.0000 mg | Freq: Once | INTRAMUSCULAR | Status: AC
  Administered 2023-10-14: 30 mg via INTRAMUSCULAR
  Filled 2023-10-14: qty 1

## 2023-10-14 MED ORDER — DEXAMETHASONE SODIUM PHOSPHATE 10 MG/ML IJ SOLN
10.0000 mg | Freq: Once | INTRAMUSCULAR | Status: AC
  Administered 2023-10-14: 10 mg via INTRAMUSCULAR
  Filled 2023-10-14: qty 1

## 2023-10-14 NOTE — Discharge Instructions (Signed)
You were seen in the emergency department today for sore throat.  As we discussed you tested negative for strep. I think you likely have a virus causing your symptoms today. We unfortunately cannot test for them all, but we treat them all the same way.   You can take ibuprofen or Tylenol for pain or fever, and I recommend alternating between the 2.  Make sure that you are drinking lots of fluids and getting plenty of rest. You can take decongestants as long as you take them with lots of water. You can use lozenges or chloraseptic spray as needed for sore throat.   Please use Tylenol or ibuprofen for pain.  You may use 600 mg ibuprofen every 6 hours or 1000 mg of Tylenol every 6 hours.  You may choose to alternate between the 2.  This would be most effective.  Do not exceed 4 g of Tylenol within 24 hours.  Do not exceed 3200 mg ibuprofen within 24 hours.  Continue to monitor how you are doing, and return to the emergency department for new or worsening symptoms such as chest pain, difficulty breathing not related to coughing, fever despite medication, or persistent vomiting or diarrhea.  It has been a pleasure taking care of you today and I hope you begin to feel better soon!

## 2023-10-14 NOTE — ED Triage Notes (Signed)
Pt with fever and sore throat x 3 days.  ?

## 2023-10-14 NOTE — ED Provider Notes (Signed)
[place] EMERGENCY DEPARTMENT AT [facility] Provider Note   CSN: [id number] Arrival date & time: 10/14/23  1744     History  Chief Complaint  Patient presents with   Sore Throat    Cynthia Duarte is a 22 y.o. female with history of asthma, ovarian cyst, who presents the emergency department complaining of sore throat.  Patient states she has had an on and off fever as well as sore throat for the past 3 days.  She went to urgent care yesterday and was told she had a throat infection, was given lidocaine and promethazine syrup. States throat is still very painful, having difficulty swallowing. Not coughing.    Sore Throat       Home Medications Prior to Admission medications   Medication Sig Start Date End Date Taking? Authorizing Provider  albuterol (VENTOLIN HFA) 108 (90 Base) MCG/ACT inhaler Inhale into the lungs. 02/11/20   [provider]  lidocaine (XYLOCAINE) 2 % solution Use as directed 10 mLs in the mouth or throat every 3 (three) hours as needed. 10/12/23   Particia Nearing, PA-C  metroNIDAZOLE (METROGEL) 0.75 % gel Insert one applicatorful (5g) of medicine into the vagina once nightly x 5 days 08/21/23   Waldon Merl, PA-C  Prenatal 27-1 MG TABS TAKE 1 TABLET BY MOUTH DAILY AT 12 NOON 06/27/22   Adline Potter, NP  promethazine-dextromethorphan (PROMETHAZINE-DM) 6.25-15 MG/5ML syrup Take 5 mLs by mouth 4 (four) times daily as needed. 10/12/23   Particia Nearing, PA-C      Allergies    Omnicef [cefdinir] and Amoxicillin-pot clavulanate    Review of Systems   Review of Systems  Constitutional:  Positive for fever.  HENT:  Positive for sore throat.   All other systems reviewed and are negative.   Physical Exam Updated Vital Signs BP 105/72   Pulse (!) 114   Temp (!) 101.9 F (38.8 C) (Oral)   Resp 18   Ht 5\' 2"  (1.575 m)   Wt 106.6 kg   LMP 10/02/2023   SpO2 100%   BMI 42.98 kg/m  Physical Exam Vitals and  nursing note reviewed.  Constitutional:      Appearance: Normal appearance.  HENT:     Head: Normocephalic and atraumatic.     Mouth/Throat:     Lips: Pink.     Mouth: Mucous membranes are moist.     Pharynx: Oropharynx is clear. Uvula midline.     Tonsils: Tonsillar exudate present. No tonsillar abscesses. 2+ on the right. 2+ on the left.  Eyes:     Conjunctiva/sclera: Conjunctivae normal.  Neck:     Comments: Bilateral superficial cervical adenopathy Cardiovascular:     Rate and Rhythm: Normal rate and regular rhythm.  Pulmonary:     Effort: Pulmonary effort is normal. No respiratory distress.     Breath sounds: Normal breath sounds.  Abdominal:     General: There is no distension.     Palpations: Abdomen is soft.     Tenderness: There is no abdominal tenderness.  Lymphadenopathy:     Cervical: Cervical adenopathy present.     Right cervical: Superficial cervical adenopathy present.     Left cervical: Superficial cervical adenopathy present.  Skin:    General: Skin is warm and dry.  Neurological:     General: No focal deficit present.     Mental Status: She is alert.     ED Results / Procedures / Treatments   Labs (  all labs ordered are listed, but only abnormal results are displayed) Labs Reviewed  GROUP A STREP BY PCR    EKG None  Radiology No results found.  Procedures Procedures    Medications Ordered in ED Medications  ketorolac (TORADOL) 30 MG/ML injection 30 mg (30 mg Intramuscular Given 10/14/23 1907)  dexamethasone (DECADRON) injection 10 mg (10 mg Intramuscular Given 10/14/23 1908)    ED Course/ Medical Decision Making/ A&P                                 Medical Decision Making Risk Prescription drug management.   This patient is a 22 y.o. female who presents to the ED for concern of sore throat.   Differential diagnoses prior to evaluation: The emergent differential diagnosis includes, but is not limited to, Viral pharyngitis, strep  pharyngitis, dental caries/abscess, esophagitis, sinusitis, post nasal drip, reflux, angioedema, RTA/PTA, Ludwig's angina. This is not an exhaustive differential.   Past Medical History / Co-morbidities / Additional history: Chart reviewed. Pertinent results include: asthma, ovarian cysts Reviewed urgent care visit note from yesterday, patient had negative COVID, flu, and strep testing.  She was discharged with promethazine syrup and lidocaine.  Physical Exam: Physical exam performed. The pertinent findings include: Febrile to 101.57F, tachycardic to 114.  Bilateral 2+ tonsillar swelling with white exudate, no abscess.  No submandibular or sublingual edema.  Lab Tests/Imaging studies: I personally interpreted labs/imaging and the pertinent results include:  strep test negative.    Medications: I ordered medication including toradol and decadron.  I have reviewed the patients home medicines and have made adjustments as needed.   Disposition: After consideration of the diagnostic results and the patients response to treatment, I feel that emergency department workup does not suggest an emergent condition requiring admission or immediate intervention beyond what has been performed at this time. Patient with symptoms consistent with viral pharyngitis/tonsilitis.  Vitals are stable. No signs of dehydration, tolerating PO's.  Lungs are clear.   The plan is: Patient will be discharged with instructions to orally hydrate, rest, and use over-the-counter medications such as anti-inflammatories such as ibuprofen and Tylenol for fever. The patient is safe for discharge and has been instructed to return immediately for worsening symptoms, change in symptoms or any other concerns.  Final Clinical Impression(s) / ED Diagnoses Final diagnoses:  Viral pharyngitis  Tonsillitis    Rx / DC Orders ED Discharge Orders     None      Portions of this report may have been transcribed using voice recognition  software. Every effort was made to ensure accuracy; however, inadvertent computerized transcription errors may be present.    Jeanella Flattery 10/14/23 1911    Loetta Rough, MD 10/14/23 1911

## 2024-06-01 ENCOUNTER — Ambulatory Visit: Payer: Self-pay

## 2024-09-27 ENCOUNTER — Encounter (HOSPITAL_COMMUNITY): Payer: Self-pay

## 2024-09-27 ENCOUNTER — Emergency Department (HOSPITAL_COMMUNITY)
Admission: EM | Admit: 2024-09-27 | Discharge: 2024-09-27 | Disposition: A | Discharge status: Home / Self Care | Attending: Emergency Medicine | Admitting: Emergency Medicine

## 2024-09-27 DIAGNOSIS — M7989 Other specified soft tissue disorders: Secondary | ICD-10-CM | POA: Diagnosis present

## 2024-09-27 DIAGNOSIS — L03011 Cellulitis of right finger: Secondary | ICD-10-CM | POA: Insufficient documentation

## 2024-09-27 MED ORDER — DOXYCYCLINE HYCLATE 100 MG PO CAPS: 100.0000 mg | ORAL_CAPSULE | Freq: Two times a day (BID) | ORAL | 0 refills | Status: AC

## 2024-09-27 NOTE — Discharge Instructions (Signed)
 Warm soaks twice a day for at least 10 minutes, gentle massage of the area that is swollen to help the pus drain, doxycycline  twice a day, Tylenol  or Motrin  for pain, see your doctor in 3 days for recheck, ER for severe worsening symptoms

## 2024-09-27 NOTE — ED Provider Notes (Signed)
 [place] [facility] DEPARTMENT AT [facility] Provider Note   CSN: [id number] Arrival date & time: 09/27/24  1943     Patient presents with: Finger Injury (Right pinky finger)   Cynthia Duarte is a 23 y.o. female.   HPI   Patient accidentally cut her right small finger with a knife near the nailbed 2 days ago while preparing food now has some swelling in that area, no fever no redness no pus draining from the wound  Prior to Admission medications   Medication Sig Start Date End Date Taking? Authorizing Provider  doxycycline  (VIBRAMYCIN ) 100 MG capsule Take 1 capsule (100 mg total) by mouth 2 (two) times daily. 09/27/24  Yes Cleotilde Rogue, MD  albuterol (VENTOLIN HFA) 108 (90 Base) MCG/ACT inhaler Inhale into the lungs. 02/11/20   [provider]  lidocaine  (XYLOCAINE ) 2 % solution Use as directed 10 mLs in the mouth or throat every 3 (three) hours as needed. 10/12/23   Stuart Vernell Norris, PA-C  metroNIDAZOLE  (METROGEL ) 0.75 % gel Insert one applicatorful (5g) of medicine into the vagina once nightly x 5 days 08/21/23   Gladis Elsie BROCKS, PA-C  Prenatal 27-1 MG TABS TAKE 1 TABLET BY MOUTH DAILY AT 12 NOON 06/27/22   Signa Delon LABOR, NP  promethazine -dextromethorphan (PROMETHAZINE -DM) 6.25-15 MG/5ML syrup Take 5 mLs by mouth 4 (four) times daily as needed. 10/12/23   Stuart Vernell Norris, PA-C    Allergies: Omnicef [cefdinir] and Amoxicillin-pot clavulanate    Review of Systems  All other systems reviewed and are negative.   Updated Vital Signs BP 106/77 (BP Location: Right Arm)   Pulse 77   Temp 98.5 F (36.9 C) (Oral)   Resp 18   Ht 1.575 m (5' 2)   Wt 88.9 kg   SpO2 100%   BMI 35.85 kg/m   Physical Exam Vitals and nursing note reviewed.  Constitutional:      Appearance: She is well-developed. She is not diaphoretic.  HENT:     Head: Normocephalic and atraumatic.  Eyes:     General:        Right eye: No discharge.        Left  eye: No discharge.     Conjunctiva/sclera: Conjunctivae normal.  Pulmonary:     Effort: Pulmonary effort is normal. No respiratory distress.  Musculoskeletal:     Comments: Mild swelling and tenderness at the base of the eponychial fold of the right small finger.  This is not circumferential, it is not red, there is no tenderness to the pad of the finger, no decreased range of motion of any of the joints of the finger  Skin:    General: Skin is warm and dry.     Findings: No erythema or rash.  Neurological:     Mental Status: She is alert.     Coordination: Coordination normal.     (all labs ordered are listed, but only abnormal results are displayed) Labs Reviewed - No data to display  EKG: None  Radiology: No results found.   Procedures   Medications Ordered in the ED - No data to display                                  Medical Decision Making Risk Prescription drug management.  Small paronychia, only 2 days, no systemic symptoms or signs of cellulitis, doxycycline  hot soaks at home  Patient agreeable to  the plan      Final diagnoses:  Paronychia, finger, right    ED Discharge Orders          Ordered    doxycycline  (VIBRAMYCIN ) 100 MG capsule  2 times daily        09/27/24 2030               Cleotilde Rogue, MD 09/27/24 2037

## 2024-09-27 NOTE — ED Triage Notes (Signed)
 Pt comes in for possible infection in the right pinky finger. Pt cut on Thursday making dinner and has noticed it started to beome more swollen. Finger is hot to touch and pt can feel her pulse. A&Ox4.

## 2024-12-03 ENCOUNTER — Ambulatory Visit: Admitting: Adult Health

## 2024-12-08 ENCOUNTER — Encounter

## 2024-12-08 ENCOUNTER — Ambulatory Visit: Admitting: Adult Health

## 2025-01-06 ENCOUNTER — Encounter: Admitting: Obstetrics & Gynecology
